# Patient Record
Sex: Female | Born: 1990 | Race: Black or African American | Hispanic: No | Marital: Single | Smoking: Former smoker
Health system: Southern US, Community
[De-identification: ages and names within clinical notes are randomized; demographics above are authoritative.]

## PROBLEM LIST (undated history)

## (undated) DIAGNOSIS — M546 Pain in thoracic spine: Secondary | ICD-10-CM

## (undated) DIAGNOSIS — F419 Anxiety disorder, unspecified: Secondary | ICD-10-CM

## (undated) DIAGNOSIS — D649 Anemia, unspecified: Secondary | ICD-10-CM

## (undated) DIAGNOSIS — F329 Major depressive disorder, single episode, unspecified: Secondary | ICD-10-CM

## (undated) DIAGNOSIS — F32A Depression, unspecified: Secondary | ICD-10-CM

## (undated) DIAGNOSIS — M419 Scoliosis, unspecified: Secondary | ICD-10-CM

## (undated) HISTORY — DX: Pain in thoracic spine: M54.6

## (undated) HISTORY — DX: Scoliosis, unspecified: M41.9

## (undated) HISTORY — DX: Depression, unspecified: F32.A

## (undated) HISTORY — DX: Anxiety disorder, unspecified: F41.9

---
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
## 2016-03-09 ENCOUNTER — Encounter (HOSPITAL_COMMUNITY): Payer: Self-pay | Admitting: *Deleted

## 2016-03-09 ENCOUNTER — Emergency Department (HOSPITAL_COMMUNITY)
Admission: EM | Admit: 2016-03-09 | Discharge: 2016-03-09 | Disposition: A | Discharge status: Home / Self Care | Payer: Medicaid Other | Attending: Emergency Medicine | Admitting: Emergency Medicine

## 2016-03-09 ENCOUNTER — Emergency Department (HOSPITAL_COMMUNITY): Payer: Medicaid Other

## 2016-03-09 DIAGNOSIS — R079 Chest pain, unspecified: Secondary | ICD-10-CM | POA: Insufficient documentation

## 2016-03-09 DIAGNOSIS — J111 Influenza due to unidentified influenza virus with other respiratory manifestations: Secondary | ICD-10-CM | POA: Insufficient documentation

## 2016-03-09 DIAGNOSIS — F17200 Nicotine dependence, unspecified, uncomplicated: Secondary | ICD-10-CM | POA: Insufficient documentation

## 2016-03-09 DIAGNOSIS — R109 Unspecified abdominal pain: Secondary | ICD-10-CM | POA: Diagnosis not present

## 2016-03-09 DIAGNOSIS — R6889 Other general symptoms and signs: Secondary | ICD-10-CM

## 2016-03-09 HISTORY — DX: Anemia, unspecified: D64.9

## 2016-03-09 LAB — URINALYSIS, ROUTINE W REFLEX MICROSCOPIC
Bilirubin Urine: NEGATIVE
GLUCOSE, UA: NEGATIVE mg/dL
HGB URINE DIPSTICK: NEGATIVE
KETONES UR: NEGATIVE mg/dL
LEUKOCYTES UA: NEGATIVE
Nitrite: NEGATIVE
PH: 6 (ref 5.0–8.0)
Protein, ur: NEGATIVE mg/dL
Specific Gravity, Urine: 1.019 (ref 1.005–1.030)

## 2016-03-09 LAB — PREGNANCY, URINE: Preg Test, Ur: NEGATIVE

## 2016-03-09 LAB — RAPID STREP SCREEN (MED CTR MEBANE ONLY): Streptococcus, Group A Screen (Direct): NEGATIVE

## 2016-03-09 MED ORDER — DOXYCYCLINE HYCLATE 100 MG PO CAPS: 100.0000 mg | ORAL_CAPSULE | Freq: Two times a day (BID) | ORAL | Status: DC

## 2016-03-09 MED ORDER — ACETAMINOPHEN 325 MG PO TABS
650.0000 mg | ORAL_TABLET | Freq: Once | ORAL | Status: AC | PRN
  Administered 2016-03-09: 650 mg via ORAL
  Filled 2016-03-09: qty 2

## 2016-03-09 NOTE — Progress Notes (Signed)
Entered in d/c instructions  [facility] Schedule an appointment as soon as possible for a visit on 03/09/2016 As needed This is your assigned Medicaid [place] access doctor If you prefer to see another Medicaid doctor other than the one on your Medicaid card PLEASE CALL DSS [phone] or [phone] [address] [place] [phone] Medicaid [place] Access Covered Patient [place]: [phone] [address] [place], [place] [id number] [email or web address].ncdhhs.gov/ Use this website to assist with understanding your coverage & to renew application As a Medicaid client you MUST contact DSS/SSI each time you change address, move to another [place] county or another state to keep your address updated  [place] Transportation to Dr appts if you are have full Medicaid: [phone], [phone]

## 2016-03-09 NOTE — ED Notes (Signed)
Generalized weakness, body aches, chills x 2 days sore throat today, vomited one time

## 2016-03-09 NOTE — Progress Notes (Signed)
[facility] is [facility] [address] [place] [phone]

## 2016-03-09 NOTE — Discharge Instructions (Signed)
Fever, Adult A fever is an increase in the body's temperature. It is usually defined as a temperature of 100F (38C) or higher. Brief mild or moderate fevers generally have no long-term effects, and they often do not require treatment. Moderate or high fevers may make you feel uncomfortable and can sometimes be a sign of a serious illness or disease. The sweating that may occur with repeated or prolonged fever may also cause dehydration. Fever is confirmed by taking a temperature with a thermometer. A measured temperature can vary with:  Age.  Time of day.  Location of the thermometer:  Mouth (oral).  Rectum (rectal).  Ear (tympanic).  Underarm (axillary).  Forehead (temporal). HOME CARE INSTRUCTIONS Pay attention to any changes in your symptoms. Take these actions to help with your condition:  Take over-the counter and prescription medicines only as told by your health care provider. Follow the dosing instructions carefully.  If you were prescribed an antibiotic medicine, take it as told by your health care provider. Do not stop taking the antibiotic even if you start to feel better.  Rest as needed.  Drink enough fluid to keep your urine clear or pale yellow. This helps to prevent dehydration.  Sponge yourself or bathe with room-temperature water to help reduce your body temperature as needed. Do not use ice water.  Do not overbundle yourself in blankets or heavy clothes. SEEK MEDICAL CARE IF:  You vomit.  You cannot eat or drink without vomiting.  You have diarrhea.  You have pain when you urinate.  Your symptoms do not improve with treatment.  You develop new symptoms.  You develop excessive weakness. SEEK IMMEDIATE MEDICAL CARE IF:  You have shortness of breath or have trouble breathing.  You are dizzy or you faint.  You are disoriented or confused.  You develop signs of dehydration, such as a dry mouth, decreased urination, or paleness.  You develop  severe pain in your abdomen.  You have persistent vomiting or diarrhea.  You develop a skin rash.  Your symptoms suddenly get worse.   This information is not intended to replace advice given to you by your health care provider. Make sure you discuss any questions you have with your health care provider.   Document Released: 01/30/2001 Document Revised: 04/27/2015 Document Reviewed: 09/30/2014 Elsevier Interactive Patient Education 2016 Elsevier Inc. Upper Respiratory Infection, Adult Most upper respiratory infections (URIs) are a viral infection of the air passages leading to the lungs. A URI affects the nose, throat, and upper air passages. The most common type of URI is nasopharyngitis and is typically referred to as "the common cold." URIs run their course and usually go away on their own. Most of the time, a URI does not require medical attention, but sometimes a bacterial infection in the upper airways can follow a viral infection. This is called a secondary infection. Sinus and middle ear infections are common types of secondary upper respiratory infections. Bacterial pneumonia can also complicate a URI. A URI can worsen asthma and chronic obstructive pulmonary disease (COPD). Sometimes, these complications can require emergency medical care and may be life threatening.  CAUSES Almost all URIs are caused by viruses. A virus is a type of germ and can spread from one person to another.  RISKS FACTORS You may be at risk for a URI if:   You smoke.   You have chronic heart or lung disease.  You have a weakened defense (immune) system.   You are very young or  very old.   You have nasal allergies or asthma.  You work in crowded or poorly ventilated areas.  You work in health care facilities or schools. SIGNS AND SYMPTOMS  Symptoms typically develop 2-3 days after you come in contact with a cold virus. Most viral URIs last 7-10 days. However, viral URIs from the influenza virus  (flu virus) can last 14-18 days and are typically more severe. Symptoms may include:   Runny or stuffy (congested) nose.   Sneezing.   Cough.   Sore throat.   Headache.   Fatigue.   Fever.   Loss of appetite.   Pain in your forehead, behind your eyes, and over your cheekbones (sinus pain).  Muscle aches.  DIAGNOSIS  Your health care provider may diagnose a URI by:  Physical exam.  Tests to check that your symptoms are not due to another condition such as:  Strep throat.  Sinusitis.  Pneumonia.  Asthma. TREATMENT  A URI goes away on its own with time. It cannot be cured with medicines, but medicines may be prescribed or recommended to relieve symptoms. Medicines may help:  Reduce your fever.  Reduce your cough.  Relieve nasal congestion. HOME CARE INSTRUCTIONS   Take medicines only as directed by your health care provider.   Gargle warm saltwater or take cough drops to comfort your throat as directed by your health care provider.  Use a warm mist humidifier or inhale steam from a shower to increase air moisture. This may make it easier to breathe.  Drink enough fluid to keep your urine clear or pale yellow.   Eat soups and other clear broths and maintain good nutrition.   Rest as needed.   Return to work when your temperature has returned to normal or as your health care provider advises. You may need to stay home longer to avoid infecting others. You can also use a face mask and careful hand washing to prevent spread of the virus.  Increase the usage of your inhaler if you have asthma.   Do not use any tobacco products, including cigarettes, chewing tobacco, or electronic cigarettes. If you need help quitting, ask your health care provider. PREVENTION  The best way to protect yourself from getting a cold is to practice good hygiene.   Avoid oral or hand contact with people with cold symptoms.   Wash your hands often if contact occurs.   There is no clear evidence that vitamin C, vitamin E, echinacea, or exercise reduces the chance of developing a cold. However, it is always recommended to get plenty of rest, exercise, and practice good nutrition.  SEEK MEDICAL CARE IF:   You are getting worse rather than better.   Your symptoms are not controlled by medicine.   You have chills.  You have worsening shortness of breath.  You have brown or red mucus.  You have yellow or brown nasal discharge.  You have pain in your face, especially when you bend forward.  You have a fever.  You have swollen neck glands.  You have pain while swallowing.  You have white areas in the back of your throat. SEEK IMMEDIATE MEDICAL CARE IF:   You have severe or persistent:  Headache.  Ear pain.  Sinus pain.  Chest pain.  You have chronic lung disease and any of the following:  Wheezing.  Prolonged cough.  Coughing up blood.  A change in your usual mucus.  You have a stiff neck.  You have changes in  your:  Vision.  Hearing.  Thinking.  Mood. MAKE SURE YOU:   Understand these instructions.  Will watch your condition.  Will get help right away if you are not doing well or get worse.   This information is not intended to replace advice given to you by your health care provider. Make sure you discuss any questions you have with your health care provider.   Document Released: 01/30/2001 Document Revised: 12/21/2014 Document Reviewed: 11/11/2013 Elsevier Interactive Patient Education 2016 Elsevier Inc. Doxycycline tablets or capsules What is this medicine? DOXYCYCLINE (dox i SYE kleen) is a tetracycline antibiotic. It kills certain bacteria or stops their growth. It is used to treat many kinds of infections, like dental, skin, respiratory, and urinary tract infections. It also treats acne, Lyme disease, malaria, and certain sexually transmitted infections. This medicine may be used for other purposes; ask  your health care provider or pharmacist if you have questions. What should I tell my health care provider before I take this medicine? They need to know if you have any of these conditions: -liver disease -long exposure to sunlight like working outdoors -stomach problems like colitis -an unusual or allergic reaction to doxycycline, tetracycline antibiotics, other medicines, foods, dyes, or preservatives -pregnant or trying to get pregnant -breast-feeding How should I use this medicine? Take this medicine by mouth with a full glass of water. Follow the directions on the prescription label. It is best to take this medicine without food, but if it upsets your stomach take it with food. Take your medicine at regular intervals. Do not take your medicine more often than directed. Take all of your medicine as directed even if you think you are better. Do not skip doses or stop your medicine early. Talk to your pediatrician regarding the use of this medicine in children. While this drug may be prescribed for selected conditions, precautions do apply. Overdosage: If you think you have taken too much of this medicine contact a poison control center or emergency room at once. NOTE: This medicine is only for you. Do not share this medicine with others. What if I miss a dose? If you miss a dose, take it as soon as you can. If it is almost time for your next dose, take only that dose. Do not take double or extra doses. What may interact with this medicine? -antacids -barbiturates -birth control pills -bismuth subsalicylate -carbamazepine -methoxyflurane -other antibiotics -phenytoin -vitamins that contain iron -warfarin This list may not describe all possible interactions. Give your health care provider a list of all the medicines, herbs, non-prescription drugs, or dietary supplements you use. Also tell them if you smoke, drink alcohol, or use illegal drugs. Some items may interact with your  medicine. What should I watch for while using this medicine? Tell your doctor or health care professional if your symptoms do not improve. Do not treat diarrhea with over the counter products. Contact your doctor if you have diarrhea that lasts more than 2 days or if it is severe and watery. Do not take this medicine just before going to bed. It may not dissolve properly when you lay down and can cause pain in your throat. Drink plenty of fluids while taking this medicine to also help reduce irritation in your throat. This medicine can make you more sensitive to the sun. Keep out of the sun. If you cannot avoid being in the sun, wear protective clothing and use sunscreen. Do not use sun lamps or tanning beds/booths. Birth control  pills may not work properly while you are taking this medicine. Talk to your doctor about using an extra method of birth control. If you are being treated for a sexually transmitted infection, avoid sexual contact until you have finished your treatment. Your sexual partner may also need treatment. Avoid antacids, aluminum, calcium, magnesium, and iron products for 4 hours before and 2 hours after taking a dose of this medicine. If you are using this medicine to prevent malaria, you should still protect yourself from contact with mosquitos. Stay in screened-in areas, use mosquito nets, keep your body covered, and use an insect repellent. What side effects may I notice from receiving this medicine? Side effects that you should report to your doctor or health care professional as soon as possible: -allergic reactions like skin rash, itching or hives, swelling of the face, lips, or tongue -difficulty breathing -fever -itching in the rectal or genital area -pain on swallowing -redness, blistering, peeling or loosening of the skin, including inside the mouth -severe stomach pain or cramps -unusual bleeding or bruising -unusually weak or tired -yellowing of the eyes or  skin Side effects that usually do not require medical attention (report to your doctor or health care professional if they continue or are bothersome): -diarrhea -loss of appetite -nausea, vomiting This list may not describe all possible side effects. Call your doctor for medical advice about side effects. You may report side effects to FDA at 1-800-FDA-1088. Where should I keep my medicine? Keep out of the reach of children. Store at room temperature, below 30 degrees C (86 degrees F). Protect from light. Keep container tightly closed. Throw away any unused medicine after the expiration date. Taking this medicine after the expiration date can make you seriously ill. NOTE: This sheet is a summary. It may not cover all possible information. If you have questions about this medicine, talk to your doctor, pharmacist, or health care provider.    2016, Elsevier/Gold Standard. (2014-11-26 12:10:28)

## 2016-03-09 NOTE — ED Provider Notes (Signed)
CSN: [id number]     Arrival date & time 03/09/16  1456 History  By signing my name below, I, [facility] Inc[facility]arrissa Howard, attest that this documentation has been prepared under the direction and in the presence of Arthor CaptainAbigail Jolane Bankhead, PA-C. Electronically Signed: Randell PatientMarrissa Howard, ED Scribe. 03/09/2016. 5:15 PM.    Chief Complaint  Patient presents with  . flu like symptoms      The history is provided by the patient. No language interpreter was used.   HPI Comments: Ruth Howard is a 25 y.o. female who presents to the Emergency Department complaining of constant, moderate, sore generalized body aches, worse in the central back onset 2 days ago. Pt states that she noticed an insect fly up and bite her on the left chest earlier this week followed by a cough and then by a sore throat, sore CP, mild SOB, and subjective fever (TMAX 101.6 in ED today) over the past 2 days. She reports left flank pain. She has taken Tylenol without relief. Pt states that she is allergic to NSAIDs which cause her throat to swell. Denies hx of asthma. Denies recent tick bites. Denies urinary frequency or urgency.  Past Medical History  Diagnosis Date  . Anemia    Past Surgical History  Procedure Laterality Date  . Cesarean section     No family history on file. Social History  Substance Use Topics  . Smoking status: Current Every Day Smoker  . Smokeless tobacco: None  . Alcohol Use: No   OB History    No data available     Review of Systems  Constitutional: Positive for fever.  Respiratory: Positive for cough and shortness of breath.   Cardiovascular: Positive for chest pain.  Genitourinary: Positive for flank pain. Negative for urgency and frequency.      Allergies  Nsaids  Home Medications   Prior to Admission medications   Medication Sig Start Date End Date Taking? Authorizing Provider  doxycycline (VIBRAMYCIN) 100 MG capsule Take 1 capsule (100 mg total) by mouth 2 (two) times daily. 03/09/16    Jemari Hallum, PA-C   BP 109/70 mmHg  Pulse 108  Temp(Src) 100 F (37.8 C) (Oral)  Resp 18  Ht 5\' 1"  (1.549 m)  Wt 130 lb (58.968 kg)  BMI 24.58 kg/m2  SpO2 100%  LMP 02/21/2016 Physical Exam  Constitutional: She is oriented to person, place, and time. She appears well-developed and well-nourished. No distress.  HENT:  Head: Normocephalic and atraumatic.  Mouth/Throat: Posterior oropharyngeal erythema present. No tonsillar abscesses.  Mild erythema of posterior oropharnyx. No tonsillar lymphadenopathy.  Eyes: Conjunctivae and EOM are normal. Pupils are equal, round, and reactive to light. No scleral icterus.  No horizontal, vertical or rotational nystagmus  Neck: Normal range of motion. Neck supple.  Full active and passive ROM without pain No midline or paraspinal tenderness No nuchal rigidity or meningeal signs  Cardiovascular: Normal rate, regular rhythm and intact distal pulses.   Pulmonary/Chest: Effort normal and breath sounds normal. No respiratory distress. She has no wheezes. She has no rales.  Lungs CTA bilaterally.  Abdominal: Soft. Bowel sounds are normal. There is no tenderness. There is no rebound and no guarding.  Musculoskeletal: Normal range of motion.  Lymphadenopathy:    She has no cervical adenopathy.  Neurological: She is alert and oriented to person, place, and time. She has normal reflexes. No cranial nerve deficit. She exhibits normal muscle tone. Coordination normal.  Mental Status:  Alert, oriented, thought content appropriate. Speech fluent  without evidence of aphasia. Able to follow 2 step commands without difficulty.  Cranial Nerves:  II:  Peripheral visual fields grossly normal, pupils equal, round, reactive to light III,IV, VI: ptosis not present, extra-ocular motions intact bilaterally  V,VII: smile symmetric, facial light touch sensation equal VIII: hearing grossly normal bilaterally  IX,X: midline uvula rise  XI: bilateral shoulder shrug  equal and strong XII: midline tongue extension  Motor:  5/5 in upper and lower extremities bilaterally including strong and equal grip strength and dorsiflexion/plantar flexion Sensory: Pinprick and light touch normal in all extremities.  Deep Tendon Reflexes: 2+ and symmetric  Cerebellar: normal finger-to-nose with bilateral upper extremities Gait: normal gait and balance CV: distal pulses palpable throughout   Skin: Skin is warm and dry. No rash noted. She is not diaphoretic.  Psychiatric: She has a normal mood and affect. Her behavior is normal. Judgment and thought content normal.  Nursing note and vitals reviewed.   ED Course  Procedures   DIAGNOSTIC STUDIES: Oxygen Saturation is 100% on RA, normal by my interpretation.    COORDINATION OF CARE: 4:14 PM Discussed results of rapid strep test. Will order chest x-ray and urinalysis. Discussed treatment plan with pt at bedside and pt agreed to plan.  5:27 PM Reviewed results of labs and chest imaging. Will prescribe doxycycline. Will discharge pt.  Labs Review Labs Reviewed  URINALYSIS, ROUTINE W REFLEX MICROSCOPIC (NOT AT [facility]) - Abnormal; Notable for the following:    APPearance CLOUDY (*)    All other components within normal limits  RAPID STREP SCREEN (NOT AT [facility])  CULTURE, GROUP A STREP [facility])  PREGNANCY, URINE    Imaging Review Dg Chest 2 View  03/09/2016  CLINICAL DATA:  Fever for 2 days. EXAM: CHEST  2 VIEW COMPARISON:  None. FINDINGS: The heart size and mediastinal contours are within normal limits. Both lungs are clear. Mild rightward curvature is present in the lower thoracic spine. Vertebral body heights and alignment are maintained. IMPRESSION: No active cardiopulmonary disease. Electronically Signed   By: Marin Roberts M.D.   On: 03/09/2016 16:57   I have personally reviewed and evaluated these images and lab results as part of my medical decision-making.   MDM   Final diagnoses:  Flu-like symptoms    No signs of meningitis Patient with flu-like symptoms in the summertime. Fever improved with Tylenol. Vitals are stable, low-grade fever.  No signs of dehydration, tolerating PO's. Lungs are clear. Due to patient's presentation and physical exam a chest x-ray was ordered. Discussed tick-borne illnesses with pt and will treat with 10-day course of doxycycline. Patient will be discharged with instructions to orally hydrate, rest, and use Tylenol for fever.  I personally performed the services described in this documentation, which was scribed in my presence. The recorded information has been reviewed and is accurate.       Arthor Captain, PA-C 03/09/16 1836   Leta Baptist, MD 03/12/16 [phone]

## 2016-03-12 LAB — CULTURE, GROUP A STREP (THRC)

## 2016-05-21 ENCOUNTER — Encounter (HOSPITAL_COMMUNITY): Payer: Self-pay | Admitting: Emergency Medicine

## 2016-05-21 ENCOUNTER — Emergency Department (HOSPITAL_COMMUNITY)
Admission: EM | Admit: 2016-05-21 | Discharge: 2016-05-21 | Disposition: A | Discharge status: Home / Self Care | Payer: Medicaid Other | Attending: Emergency Medicine | Admitting: Emergency Medicine

## 2016-05-21 DIAGNOSIS — Z87891 Personal history of nicotine dependence: Secondary | ICD-10-CM | POA: Diagnosis not present

## 2016-05-21 DIAGNOSIS — K0889 Other specified disorders of teeth and supporting structures: Secondary | ICD-10-CM

## 2016-05-21 DIAGNOSIS — K029 Dental caries, unspecified: Secondary | ICD-10-CM | POA: Diagnosis not present

## 2016-05-21 MED ORDER — CLINDAMYCIN HCL 150 MG PO CAPS: 450.0000 mg | ORAL_CAPSULE | Freq: Three times a day (TID) | ORAL | 0 refills | Status: DC

## 2016-05-21 MED ORDER — IBUPROFEN 800 MG PO TABS: 800.0000 mg | ORAL_TABLET | Freq: Three times a day (TID) | ORAL | 0 refills | Status: DC

## 2016-05-21 NOTE — Discharge Instructions (Signed)
Please follow up with a Dentist for further evaluation and management.   °Check this website for free, low-income or sliding scale dental services in [place]. www.freedental.us   °To find a dentist in the [place] or surrounding areas check this website: [email or web address] ° °

## 2016-05-21 NOTE — ED Triage Notes (Signed)
Pt complaint of generalized dental pain related to multiple cavities worsening over past week.

## 2016-05-21 NOTE — ED Provider Notes (Signed)
[facility]-EMERGENCY DEPT Provider Note   CSN: [id number] Arrival date & time: 05/21/16  1421  By signing my name below, I, Soijett Blue, attest that this documentation has been prepared under the direction and in the presence of Cheri FowlerKayla Jazae Gandolfi, PA-C Electronically Signed: Soijett Blue, ED Scribe. 05/21/16. 3:26 PM.   History   Chief Complaint Chief Complaint  Patient presents with  . Dental Pain    HPI Ruth Howard is a 25 y.o. female who presents to the Emergency Department complaining of generalized dental pain onset 1 week. Pt reports that her generalized dental pain is worsened with temperature changes. Pt denies any alleviating factors for her generalized dental pain. Pt denies having a dentist. She states that she is having associated symptoms of gum swelling. She states that she has tried acetaminophen, ibuprofen, and an old abx Rx with no relief for her symptoms. Pt reports that her last dose of acetaminophen was this morning. She denies fever, tongue swelling, facial swelling, trouble swallowing, fever, chills, and any other symptoms.    The history is provided by the patient. No language interpreter was used.    Past Medical History:  Diagnosis Date  . Anemia     There are no active problems to display for this patient.   Past Surgical History:  Procedure Laterality Date  . CESAREAN SECTION      OB History    No data available       Home Medications    Prior to Admission medications   Medication Sig Start Date End Date Taking? Authorizing Provider  clindamycin (CLEOCIN) 150 MG capsule Take 3 capsules (450 mg total) by mouth 3 (three) times daily. 05/21/16   Cheri FowlerKayla Leamon Palau, PA-C  doxycycline (VIBRAMYCIN) 100 MG capsule Take 1 capsule (100 mg total) by mouth 2 (two) times daily. 03/09/16   Arthor CaptainAbigail Harris, PA-C  ibuprofen (ADVIL,MOTRIN) 800 MG tablet Take 1 tablet (800 mg total) by mouth 3 (three) times daily. 05/21/16   Cheri FowlerKayla Aireana Ryland, PA-C    Family History No family history  on file.  Social History Social History  Substance Use Topics  . Smoking status: Former Smoker    Years: 1.00  . Smokeless tobacco: Not on file     Comment: Quit 04/20/16  . Alcohol use No     Allergies   Nsaids   Review of Systems Review of Systems  Constitutional: Negative for chills and fever.  HENT: Positive for dental problem. Negative for facial swelling and trouble swallowing.      Physical Exam Updated Vital Signs BP 116/80 (BP Location: Left Arm)   Pulse 96   Temp 97.9 F (36.6 C) (Oral)   Resp 18   Ht 5\' 1"  (1.549 m)   Wt 54.4 kg   LMP 04/23/2016   SpO2 100%   BMI 22.67 kg/m   Physical Exam  Constitutional: She is oriented to person, place, and time. She appears well-developed and well-nourished.  HENT:  Head: Normocephalic and atraumatic.  Mouth/Throat: Uvula is midline, oropharynx is clear and moist and mucous membranes are normal. No trismus in the jaw. Abnormal dentition. Dental caries present.  No tongue swelling or facial swelling.  Airway patent. Patient tolerating secretions without difficulty.   Eyes: Conjunctivae are normal.  Neck: Normal range of motion. Neck supple.  No evidence of Ludwigs angina.  Cardiovascular: Normal rate, regular rhythm and normal heart sounds.   Pulmonary/Chest: Effort normal and breath sounds normal.  Abdominal: Bowel sounds are normal. She exhibits no distension.  Musculoskeletal:  Moves all extremities spontaneously.  Lymphadenopathy:    She has no cervical adenopathy.  Neurological: She is alert and oriented to person, place, and time.  Speech clear without dysarthria.   Skin: Skin is warm and dry.     ED Treatments / Results  DIAGNOSTIC STUDIES: Oxygen Saturation is 100% on RA, nl by my interpretation.    COORDINATION OF CARE: 3:23 PM Discussed treatment plan with pt at bedside which includes clindamycin Rx and pt agreed to plan.   Procedures Procedures (including critical care time)  Medications  Ordered in ED Medications - No data to display   Initial Impression / Assessment and Plan / ED Course  I have reviewed the triage vital signs and the nursing notes.   Clinical Course   Patient with dentalgia.  No abscess requiring immediate incision and drainage.  Exam not concerning for Ludwig's angina or pharyngeal abscess.  Will treat with clindamycin Rx and ibuprofen Rx. Pt instructed to follow-up with dentist.  Discussed return precautions. Pt safe for discharge.  Final Clinical Impressions(s) / ED Diagnoses   Final diagnoses:  Pain, dental    New Prescriptions New Prescriptions   CLINDAMYCIN (CLEOCIN) 150 MG CAPSULE    Take 3 capsules (450 mg total) by mouth 3 (three) times daily.   IBUPROFEN (ADVIL,MOTRIN) 800 MG TABLET    Take 1 tablet (800 mg total) by mouth 3 (three) times daily.   I personally performed the services described in this documentation, which was scribed in my presence. The recorded information has been reviewed and is accurate.      Cheri Fowler, PA-C 05/21/16 1529    Doug Sou, MD 05/22/16 0000

## 2018-03-10 DIAGNOSIS — E119 Type 2 diabetes mellitus without complications: Secondary | ICD-10-CM | POA: Diagnosis not present

## 2018-03-10 DIAGNOSIS — G8929 Other chronic pain: Secondary | ICD-10-CM | POA: Diagnosis not present

## 2018-06-22 DIAGNOSIS — O34211 Maternal care for low transverse scar from previous cesarean delivery: Secondary | ICD-10-CM | POA: Diagnosis not present

## 2018-06-22 DIAGNOSIS — Z3A37 37 weeks gestation of pregnancy: Secondary | ICD-10-CM | POA: Diagnosis not present

## 2018-06-22 DIAGNOSIS — O0930 Supervision of pregnancy with insufficient antenatal care, unspecified trimester: Secondary | ICD-10-CM | POA: Diagnosis not present

## 2018-06-23 DIAGNOSIS — O411290 Chorioamnionitis, unspecified trimester, not applicable or unspecified: Secondary | ICD-10-CM | POA: Diagnosis not present

## 2019-05-15 ENCOUNTER — Other Ambulatory Visit: Payer: Self-pay | Admitting: Emergency Medicine

## 2019-05-15 ENCOUNTER — Encounter (HOSPITAL_COMMUNITY): Payer: Self-pay

## 2019-05-15 ENCOUNTER — Ambulatory Visit (HOSPITAL_COMMUNITY)
Admission: EM | Admit: 2019-05-15 | Discharge: 2019-05-15 | Disposition: A | Discharge status: Home / Self Care | Payer: Medicaid Other | Attending: Family Medicine | Admitting: Family Medicine

## 2019-05-15 ENCOUNTER — Other Ambulatory Visit: Payer: Self-pay

## 2019-05-15 DIAGNOSIS — M545 Low back pain, unspecified: Secondary | ICD-10-CM

## 2019-05-15 DIAGNOSIS — Z3202 Encounter for pregnancy test, result negative: Secondary | ICD-10-CM | POA: Diagnosis not present

## 2019-05-15 DIAGNOSIS — M79604 Pain in right leg: Secondary | ICD-10-CM | POA: Diagnosis not present

## 2019-05-15 DIAGNOSIS — M79605 Pain in left leg: Secondary | ICD-10-CM | POA: Insufficient documentation

## 2019-05-15 LAB — POCT URINALYSIS DIP (DEVICE)
Bilirubin Urine: NEGATIVE
Glucose, UA: NEGATIVE mg/dL
Hgb urine dipstick: NEGATIVE
Ketones, ur: NEGATIVE mg/dL
Leukocytes,Ua: NEGATIVE
Nitrite: NEGATIVE
Protein, ur: NEGATIVE mg/dL
Specific Gravity, Urine: 1.025 (ref 1.005–1.030)
Urobilinogen, UA: 0.2 mg/dL (ref 0.0–1.0)
pH: 7 (ref 5.0–8.0)

## 2019-05-15 LAB — POCT PREGNANCY, URINE: Preg Test, Ur: NEGATIVE

## 2019-05-15 MED ORDER — IBUPROFEN 600 MG PO TABS: 600.0000 mg | ORAL_TABLET | Freq: Four times a day (QID) | ORAL | 0 refills | Status: DC | PRN

## 2019-05-15 MED ORDER — ACETAMINOPHEN 500 MG PO TABS: 500.0000 mg | ORAL_TABLET | Freq: Four times a day (QID) | ORAL | 0 refills | Status: DC | PRN

## 2019-05-15 NOTE — Discharge Instructions (Signed)
Your urine does not indicate urinary tract infection.  I have sent this for culture to confirm.  Please discontinue use of your left over amoxicillin.  Will notify you of any positive findings from your vaginal swab and if any changes to treatment are needed.   Light and regular activity as tolerated, stretching, heat application, use of ibuprofen or aleve to help with pain.  If symptoms worsen or do not improve in the next week to return to be seen or to follow up with your PCP or gynecology

## 2019-05-15 NOTE — ED Provider Notes (Signed)
[facility]    CSN: [id number] Arrival date & time: 05/15/19  1052      History   Chief Complaint Chief Complaint  Patient presents with  . Urinary Tract Infection    HPI Ruth Howard is a 28 y.o. female.   Ruth Howard presents with complaints of low back pain as well as bilateral thigh and hip pain. Certain positions worsen the pain, feels better with standing. No known injury. She does work in [employer]. Started three days ago. No fevers. No abdominal pain. Intermittent headache. Once she had burnign with urination but this has resolved. No frequency with urination. For the past three days she has been taking left over amoxicillin, twice a day. Unknown dose. Left over from dental procedure. She has been use boric acid suppositories for concern for yeast infection as well for the past week. Her discharge and itching has improved. She is sexually active  With two partners, doesn't use condoms, no known std exposure. Nausea after taking amoxicillin this morning. No vomiting or diarrhea. No known ill contacts. History  Of UTI which she feels like felt similar. Took tylenol which didn't help. Without contributing medical history.      ROS per HPI, negative if not otherwise mentioned.      Past Medical History:  Diagnosis Date  . Allergy   . Anemia     There are no active problems to display for this patient.   Past Surgical History:  Procedure Laterality Date  . CESAREAN SECTION      OB History   No obstetric history on file.      Home Medications    Prior to Admission medications   Medication Sig Start Date End Date Taking? Authorizing Provider  acetaminophen (TYLENOL) 500 MG tablet Take 1 tablet (500 mg total) by mouth every 6 (six) hours as needed. 05/15/19   Zigmund Gottron, NP  clindamycin (CLEOCIN) 150 MG capsule Take 3 capsules (450 mg total) by mouth 3 (three) times daily. 05/21/16   Gloriann Loan, PA-C  doxycycline (VIBRAMYCIN) 100 MG capsule  Take 1 capsule (100 mg total) by mouth 2 (two) times daily. 03/09/16   Harris, Vernie Shanks, PA-C  ibuprofen (ADVIL) 600 MG tablet Take 1 tablet (600 mg total) by mouth every 6 (six) hours as needed. 05/15/19   Zigmund Gottron, NP    Family History Family History  Family history unknown: Yes    Social History Social History   Tobacco Use  . Smoking status: Former Smoker    Years: 1.00  . Tobacco comment: Quit 04/20/16  Substance Use Topics  . Alcohol use: No  . Drug use: No     Allergies   Nsaids and Peanut-containing drug products   Review of Systems Review of Systems   Physical Exam Triage Vital Signs ED Triage Vitals  Enc Vitals Group     BP 05/15/19 1129 113/70     Pulse Rate 05/15/19 1129 (!) 106     Resp 05/15/19 1129 17     Temp 05/15/19 1129 98.6 F (37 C)     Temp Source 05/15/19 1129 Oral     SpO2 05/15/19 1129 100 %     Weight --      Height --      Head Circumference --      Peak Flow --      Pain Score 05/15/19 1130 6     Pain Loc --      Pain Edu? --  Excl. in GC? --    No data found.  Updated Vital Signs BP 113/70 (BP Location: Right Arm)   Pulse (!) 106   Temp 98.6 F (37 C) (Oral)   Resp 17   LMP 05/01/2019   SpO2 100%    Physical Exam Constitutional:      General: She is not in acute distress.    Appearance: She is well-developed.  Cardiovascular:     Rate and Rhythm: Normal rate.  Pulmonary:     Effort: Pulmonary effort is normal.  Abdominal:     Tenderness: There is generalized abdominal tenderness and tenderness in the suprapubic area. There is no right CVA tenderness, left CVA tenderness, guarding or rebound.  Genitourinary:    Labia:        Right: No rash, tenderness or lesion.        Left: No rash, tenderness or lesion.      Vagina: Normal.     Cervix: No cervical motion tenderness or friability.     Uterus: Normal.      Adnexa: Right adnexa normal and left adnexa normal.     Comments: Mild dilation of os with egg  white thin stretching discharge noted; no CMT; no pain with bimanual exam  Musculoskeletal:     Lumbar back: She exhibits tenderness. She exhibits no bony tenderness and no swelling.       Back:     Comments: Generalized low back pain on palpation, some pain with transition from sit to lay and lay to sit; strength equal bilaterally; gross sensation intact to lower extremities  Skin:    General: Skin is warm and dry.  Neurological:     Mental Status: She is alert and oriented to person, place, and time.      UC Treatments / Results  Labs (all labs ordered are listed, but only abnormal results are displayed) Labs Reviewed  URINE CULTURE  POC URINE PREG, ED  POCT URINALYSIS DIP (DEVICE)  POCT PREGNANCY, URINE  CERVICOVAGINAL ANCILLARY ONLY    EKG   Radiology No results found.  Procedures Procedures (including critical care time)  Medications Ordered in UC Medications - No data to display  Initial Impression / Assessment and Plan / UC Course  I have reviewed the triage vital signs and the nursing notes.  Pertinent labs & imaging results that were available during my care of the patient were reviewed by me and considered in my medical decision making (see chart for details).     Tachycardia on arrival. Patient does appear uncomfortable with back and hip pain. Exam is quite unremarkable. Urine is normal, however has been taking left over amoxicillin. Encouraged patient to discontinue this and not take left over medications. Vaginal cytology and urine culture pending. Patient declines toradol in clinic today, states will take oral medications. Suspect musculoskeletal pain? .nre If symptoms worsen or do not improve in the next week to return to be seen or to follow up with PCP.  Patient verbalized understanding and agreeable to plan.   Final Clinical Impressions(s) / UC Diagnoses   Final diagnoses:  Acute bilateral low back pain without sciatica  Leg pain, bilateral      Discharge Instructions     Your urine does not indicate urinary tract infection.  I have sent this for culture to confirm.  Please discontinue use of your left over amoxicillin.  Will notify you of any positive findings from your vaginal swab and if any changes to treatment are needed.  Light and regular activity as tolerated, stretching, heat application, use of ibuprofen or aleve to help with pain.  If symptoms worsen or do not improve in the next week to return to be seen or to follow up with your PCP or gynecology     ED Prescriptions    Medication Sig Dispense Auth. Provider   acetaminophen (TYLENOL) 500 MG tablet Take 1 tablet (500 mg total) by mouth every 6 (six) hours as needed. 30 tablet Linus Mako B, NP   ibuprofen (ADVIL) 600 MG tablet Take 1 tablet (600 mg total) by mouth every 6 (six) hours as needed. 30 tablet Georgetta Haber, NP     PDMP not reviewed this encounter.   Georgetta Haber, NP 05/15/19 1240

## 2019-05-15 NOTE — ED Triage Notes (Signed)
Pt presents with urinary tract symptoms; flank pain, nausea, urinary frequency, and urine odor X 3 days.

## 2019-05-16 LAB — URINE CULTURE: Culture: NO GROWTH

## 2019-05-18 ENCOUNTER — Telehealth (HOSPITAL_COMMUNITY): Payer: Self-pay | Admitting: Emergency Medicine

## 2019-05-18 LAB — CERVICOVAGINAL ANCILLARY ONLY
Bacterial Vaginitis (gardnerella): POSITIVE — AB
Candida Glabrata: NEGATIVE
Candida Vaginitis: NEGATIVE
Molecular Disclaimer: NEGATIVE
Molecular Disclaimer: NEGATIVE
Molecular Disclaimer: NEGATIVE
Molecular Disclaimer: NORMAL
Trichomonas: POSITIVE — AB

## 2019-05-18 MED ORDER — METRONIDAZOLE 500 MG PO TABS: 500.0000 mg | ORAL_TABLET | Freq: Two times a day (BID) | ORAL | 0 refills | Status: AC

## 2019-05-18 NOTE — Telephone Encounter (Signed)
Bacterial vaginosis is positive. This was not treated at the urgent care visit.  Flagyl 500 mg BID x 7 days #14 no refills sent to patients pharmacy of choice.    Trichomonas is positive. Rx  for Flagyl once was sent to the pharmacy of record. Pt needs education to refrain from sexual intercourse for 7 days to give the medicine time to work. Sexual partners need to be notified and tested/treated. Condoms may reduce risk of reinfection. Recheck for further evaluation if symptoms are not improving.   Pending G/C,  Patient contacted and made aware of    results, all questions answered

## 2019-05-19 LAB — CERVICOVAGINAL ANCILLARY ONLY
Chlamydia: NEGATIVE
Neisseria Gonorrhea: NEGATIVE

## 2019-05-26 ENCOUNTER — Encounter: Payer: Self-pay | Admitting: Family Medicine

## 2019-05-26 ENCOUNTER — Ambulatory Visit (INDEPENDENT_AMBULATORY_CARE_PROVIDER_SITE_OTHER): Payer: Medicaid Other | Admitting: Family Medicine

## 2019-05-26 ENCOUNTER — Other Ambulatory Visit: Payer: Self-pay

## 2019-05-26 VITALS — BP 108/58 | Wt 132.0 lb

## 2019-05-26 DIAGNOSIS — G8929 Other chronic pain: Secondary | ICD-10-CM | POA: Diagnosis not present

## 2019-05-26 DIAGNOSIS — Z0000 Encounter for general adult medical examination without abnormal findings: Secondary | ICD-10-CM | POA: Insufficient documentation

## 2019-05-26 DIAGNOSIS — F419 Anxiety disorder, unspecified: Secondary | ICD-10-CM | POA: Diagnosis not present

## 2019-05-26 DIAGNOSIS — D649 Anemia, unspecified: Secondary | ICD-10-CM | POA: Diagnosis not present

## 2019-05-26 DIAGNOSIS — F32A Depression, unspecified: Secondary | ICD-10-CM | POA: Insufficient documentation

## 2019-05-26 DIAGNOSIS — F329 Major depressive disorder, single episode, unspecified: Secondary | ICD-10-CM | POA: Diagnosis not present

## 2019-05-26 DIAGNOSIS — Z8249 Family history of ischemic heart disease and other diseases of the circulatory system: Secondary | ICD-10-CM

## 2019-05-26 DIAGNOSIS — Z833 Family history of diabetes mellitus: Secondary | ICD-10-CM | POA: Diagnosis not present

## 2019-05-26 DIAGNOSIS — R519 Headache, unspecified: Secondary | ICD-10-CM | POA: Diagnosis not present

## 2019-05-26 LAB — POCT GLYCOSYLATED HEMOGLOBIN (HGB A1C): Hemoglobin A1C: 5.4 % (ref 4.0–5.6)

## 2019-05-26 MED ORDER — SERTRALINE HCL 50 MG PO TABS: 50.0000 mg | ORAL_TABLET | Freq: Every day | ORAL | 0 refills | Status: DC
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
## 2019-05-26 NOTE — Patient Instructions (Signed)
It was great meeting you today!   -Pick up your new prescriptions from the pharmacy.   - Return for follow up in 1 month.    Please check-out at the front desk before leaving the clinic. I'd like to see you back 1 month but if you need to be seen earlier than that for any new issues we're happy to fit you in, just give [place] a call!  We are checking some labs today. If results require attention, either myself or my nurse will get in touch with you. If everything is normal, you will get a letter in the mail or a message in My Chart. Please give [place] a call if you do not hear from [place] after 2 weeks.  Please bring all of your medications with you to each visit.    Sign up for My Chart to have easy access to your labs results, and communication with your primary care physician.  Feel free to call with any questions or concerns at any time, at [phone].   Take care,  Dr. Rushie [facility]

## 2019-05-26 NOTE — Assessment & Plan Note (Addendum)
GAD 7 : Generalized Anxiety Score 05/26/2019  Nervous, Anxious, on Edge 3  Control/stop worrying 3  Worry too much - different things 3  Trouble relaxing 3  Restless 0  Easily annoyed or irritable 0  Afraid - awful might happen 3  Total GAD 7 Score 15       Depression screen PHQ 2/9 05/26/2019  Decreased Interest 1  Down, Depressed, Hopeless 1  PHQ - 2 Score 2  Altered sleeping 3  Tired, decreased energy 1  Change in appetite 1  Feeling bad or failure about yourself  0  Trouble concentrating 3  Moving slowly or fidgety/restless 0  Suicidal thoughts 0  PHQ-9 Score 10  Difficult doing work/chores Not difficult at all   Start Sertraline 50 mg daily. Follow up in 4 weeks for medication adjustment. Repeat PHQ9 and GAD 7.

## 2019-05-26 NOTE — Assessment & Plan Note (Signed)
A1c 5.4 today. BMI 24.9.

## 2019-05-26 NOTE — Assessment & Plan Note (Signed)
Check CBC and ferritin.  Patient denied hx of abnormal menstrual bleeding and hx of blood disorders in family.

## 2019-05-26 NOTE — Assessment & Plan Note (Addendum)
Unilateral chronic throbbing temporal headaches with nausea and vomiting. Etiology appears migrainous in nature. Mother died from cerebral aneurysm. No known hx of family kidney aneurysms. Will not persue imaging at this time. Consider prophylactic migraine medications if uncontrolled on OTC therapy.

## 2019-05-27 LAB — CBC WITH DIFFERENTIAL/PLATELET
Basophils Absolute: 0 10*3/uL (ref 0.0–0.2)
Basos: 1 %
EOS (ABSOLUTE): 0.1 10*3/uL (ref 0.0–0.4)
Eos: 1 %
Hematocrit: 38.2 % (ref 34.0–46.6)
Hemoglobin: 12.2 g/dL (ref 11.1–15.9)
Immature Grans (Abs): 0 10*3/uL (ref 0.0–0.1)
Immature Granulocytes: 0 %
Lymphocytes Absolute: 2.2 10*3/uL (ref 0.7–3.1)
Lymphs: 38 %
MCH: 26.2 pg — ABNORMAL LOW (ref 26.6–33.0)
MCHC: 31.9 g/dL (ref 31.5–35.7)
MCV: 82 fL (ref 79–97)
Monocytes Absolute: 0.4 10*3/uL (ref 0.1–0.9)
Monocytes: 6 %
Neutrophils Absolute: 3.1 10*3/uL (ref 1.4–7.0)
Neutrophils: 54 %
Platelets: 296 10*3/uL (ref 150–450)
RBC: 4.66 x10E6/uL (ref 3.77–5.28)
RDW: 13.2 % (ref 11.7–15.4)
WBC: 5.7 10*3/uL (ref 3.4–10.8)

## 2019-05-27 LAB — BASIC METABOLIC PANEL
BUN/Creatinine Ratio: 15 (ref 9–23)
BUN: 14 mg/dL (ref 6–20)
CO2: 23 mmol/L (ref 20–29)
Calcium: 10 mg/dL (ref 8.7–10.2)
Chloride: 102 mmol/L (ref 96–106)
Creatinine, Ser: 0.91 mg/dL (ref 0.57–1.00)
GFR calc Af Amer: 100 mL/min/{1.73_m2} (ref >59)
GFR calc non Af Amer: 87 mL/min/{1.73_m2} (ref >59)
Glucose: 80 mg/dL (ref 65–99)
Potassium: 4.4 mmol/L (ref 3.5–5.2)
Sodium: 138 mmol/L (ref 134–144)

## 2019-05-27 LAB — HIV ANTIBODY (ROUTINE TESTING W REFLEX): HIV Screen 4th Generation wRfx: NONREACTIVE

## 2019-05-27 LAB — FERRITIN: Ferritin: 34 ng/mL (ref 15–150)

## 2019-05-27 LAB — RPR: RPR Ser Ql: NONREACTIVE

## 2019-07-14 ENCOUNTER — Other Ambulatory Visit: Payer: Self-pay

## 2019-07-14 DIAGNOSIS — Z20828 Contact with and (suspected) exposure to other viral communicable diseases: Secondary | ICD-10-CM | POA: Diagnosis not present

## 2019-07-14 DIAGNOSIS — Z20822 Contact with and (suspected) exposure to covid-19: Secondary | ICD-10-CM

## 2019-07-16 LAB — NOVEL CORONAVIRUS, NAA: SARS-CoV-2, NAA: NOT DETECTED

## 2019-10-30 ENCOUNTER — Ambulatory Visit: Payer: Medicaid Other | Attending: Internal Medicine

## 2019-10-30 ENCOUNTER — Other Ambulatory Visit: Payer: Self-pay

## 2019-10-30 DIAGNOSIS — Z20822 Contact with and (suspected) exposure to covid-19: Secondary | ICD-10-CM

## 2019-10-31 LAB — NOVEL CORONAVIRUS, NAA: SARS-CoV-2, NAA: NOT DETECTED

## 2019-12-04 ENCOUNTER — Other Ambulatory Visit: Payer: Self-pay

## 2019-12-04 ENCOUNTER — Ambulatory Visit: Payer: Medicaid Other | Admitting: Family Medicine

## 2019-12-04 ENCOUNTER — Other Ambulatory Visit (HOSPITAL_COMMUNITY)
Admission: RE | Admit: 2019-12-04 | Discharge: 2019-12-04 | Disposition: A | Discharge status: Home / Self Care | Payer: Medicaid Other | Source: Ambulatory Visit | Attending: Family Medicine | Admitting: Family Medicine

## 2019-12-04 ENCOUNTER — Encounter: Payer: Self-pay | Admitting: Family Medicine

## 2019-12-04 VITALS — BP 100/68 | HR 89 | Ht 61.0 in | Wt 131.0 lb

## 2019-12-04 DIAGNOSIS — Z30011 Encounter for initial prescription of contraceptive pills: Secondary | ICD-10-CM

## 2019-12-04 DIAGNOSIS — Z7251 High risk heterosexual behavior: Secondary | ICD-10-CM | POA: Diagnosis not present

## 2019-12-04 DIAGNOSIS — N898 Other specified noninflammatory disorders of vagina: Secondary | ICD-10-CM | POA: Insufficient documentation

## 2019-12-04 DIAGNOSIS — Z114 Encounter for screening for human immunodeficiency virus [HIV]: Secondary | ICD-10-CM

## 2019-12-04 DIAGNOSIS — B9689 Other specified bacterial agents as the cause of diseases classified elsewhere: Secondary | ICD-10-CM | POA: Diagnosis not present

## 2019-12-04 DIAGNOSIS — N760 Acute vaginitis: Secondary | ICD-10-CM

## 2019-12-04 LAB — POCT URINALYSIS DIP (MANUAL ENTRY)
Bilirubin, UA: NEGATIVE
Blood, UA: NEGATIVE
Glucose, UA: NEGATIVE mg/dL
Ketones, POC UA: NEGATIVE mg/dL
Leukocytes, UA: NEGATIVE
Nitrite, UA: NEGATIVE
Protein Ur, POC: NEGATIVE mg/dL
Spec Grav, UA: >=1.03 — AB (ref 1.010–1.025)
Urobilinogen, UA: 0.2 E.U./dL
pH, UA: 6 (ref 5.0–8.0)

## 2019-12-04 LAB — POCT WET PREP (WET MOUNT)
Clue Cells Wet Prep Whiff POC: POSITIVE
Trichomonas Wet Prep HPF POC: ABSENT

## 2019-12-04 LAB — POCT URINE PREGNANCY: Preg Test, Ur: NEGATIVE

## 2019-12-04 MED ORDER — METRONIDAZOLE 500 MG PO TABS: 500.0000 mg | ORAL_TABLET | Freq: Two times a day (BID) | ORAL | 0 refills | Status: AC

## 2019-12-04 MED ORDER — NORGESTIMATE-ETH ESTRADIOL 0.25-35 MG-MCG PO TABS: 1.0000 | ORAL_TABLET | Freq: Every day | ORAL | 11 refills | Status: DC

## 2019-12-04 MED ORDER — METRONIDAZOLE 500 MG PO TABS: 500.0000 mg | ORAL_TABLET | Freq: Three times a day (TID) | ORAL | 0 refills | Status: DC

## 2019-12-04 NOTE — Progress Notes (Signed)
SUBJECTIVE:   CHIEF COMPLAINT / HPI:   CC: vaginal discharge   29 y.o. female complains of white and thin vaginal discharge for the past two weeks.. Denies abnormal vaginal bleeding or significant pelvic pain or fever. No UTI symptoms.Has no  known exposure to STD. Patient would like to discuss contraception today. Has history of trichomonas and BV. Patient had unprotected sex with a new partner on Wednesday (4/14).  Denies vaginal itching. Reports pelvic discomfort and fishy odor.   Patient's last menstrual period was 11/15/2019 (approximate). Has been taking two doses of Plan B in the past few weeks.       PERTINENT  PMH / PSH: hx of STI, high risk sexual activity   OBJECTIVE:   BP 100/68   Pulse 89   Ht 5\' 1"  (1.549 m)   Wt 131 lb (59.4 kg)   LMP 11/19/2019 (Approximate)   SpO2 98%   BMI 24.75 kg/m    General: She appears well, afebrile. Abdomen: benign, soft, nontender, no masses. Pelvic Exam: VULVA: normal appearing vulva with no masses, tenderness or lesions, VAGINA: vaginal discharge - white, malodorous and thin, CERVIX: normal appearing cervix with white discharge, lesions absent, UTERUS: uterus is normal size, shape, consistency and nontender, ADNEXA: normal adnexa in size, nontender and no masses, WET MOUNT done - results: DNA probe for chlamydia and GC obtained, exam chaperoned by CMA.  Results for orders placed or performed in visit on 12/04/19 (from the past 48 hour(s))  POCT urine pregnancy     Status: None   Collection Time: 12/04/19  4:25 PM  Result Value Ref Range   Preg Test, Ur Negative Negative  POCT urinalysis dipstick     Status: Abnormal   Collection Time: 12/04/19  4:25 PM  Result Value Ref Range   Color, UA yellow yellow   Clarity, UA cloudy (A) clear   Glucose, UA negative negative mg/dL   Bilirubin, UA negative negative   Ketones, POC UA negative negative mg/dL   Spec Grav, UA 12/06/19 (A) 1.010 - 1.025   Blood, UA negative negative   pH,  UA 6.0 5.0 - 8.0   Protein Ur, POC negative negative mg/dL   Urobilinogen, UA 0.2 0.2 or 1.0 E.U./dL   Nitrite, UA Negative Negative   Leukocytes, UA Negative Negative  POCT Wet Prep >=1.[address])     Status: Abnormal   Collection Time: 12/04/19  4:35 PM  Result Value Ref Range   Source Wet Prep POC VAG    WBC, Wet Prep HPF POC NONE    Bacteria Wet Prep HPF POC Few Few   Clue Cells Wet Prep HPF POC Few (A) None   Clue Cells Wet Prep Whiff POC Positive Whiff    Yeast Wet Prep HPF POC None None   KOH Wet Prep POC None None   Trichomonas Wet Prep HPF POC Absent Absent  HIV antibody (with reflex)     Status: None   Collection Time: 12/04/19  5:17 PM  Result Value Ref Range   HIV Screen 4th Generation wRfx Non Reactive Non Reactive  RPR     Status: None   Collection Time: 12/04/19  5:17 PM  Result Value Ref Range   RPR Ser Ql Non Reactive Non Reactive     ASSESSMENT/PLAN:   BV (bacterial vaginosis)  Treatment: Flagyl 500 BID x 7 days and abstain from coitus during course of treatment. Advised patient to not drink alcohol while taking this medication. Return if symptoms  persist or worsen.  High risk heterosexual behavior GC and chlamydia DNA  probe sent to lab. HIV and RPR collected. Advised patient to use barrier protection/condoms.   Education given regarding options for contraception, including barrier methods, injectable contraception, IUD placement, oral contraceptives. Patient choose OCPs.  The patient denies history of VTE. They have no history of hypertension or migraine with aura. They have no other contraindications for estrogen therapy.  - Start Ortho-cyclen      PAP to be completed at next visit.    Lyndee Hensen, [place]

## 2019-12-04 NOTE — Patient Instructions (Signed)
Bacterial Vaginosis  Bacterial vaginosis is an infection of the vagina. It happens when too many normal germs (healthy bacteria) grow in the vagina. This infection puts you at risk for infections from sex (STIs). Treating this infection can lower your risk for some STIs. You should also treat this if you are pregnant. It can cause your baby to be born early. Follow these instructions at home: Medicines  Take over-the-counter and prescription medicines only as told by your doctor.  Take or use your antibiotic medicine as told by your doctor. Do not stop taking or using it even if you start to feel better. General instructions  If you your sexual partner is a woman, tell her that you have this infection. She needs to get treatment if she has symptoms. If you have a female partner, he does not need to be treated.  During treatment: ? Avoid sex. ? Do not douche. ? Avoid alcohol as told. ? Avoid breastfeeding as told.  Drink enough fluid to keep your pee (urine) clear or pale yellow.  Keep your vagina and butt (rectum) clean. ? Wash the area with warm water every day. ? Wipe from front to back after you use the toilet.  Keep all follow-up visits as told by your doctor. This is important. Preventing this condition  Do not douche.  Use only warm water to wash around your vagina.  Use protection when you have sex. This includes: ? Latex condoms. ? Dental dams.  Limit how many people you have sex with. It is best to only have sex with the same person (be monogamous).  Get tested for STIs. Have your partner get tested.  Wear underwear that is cotton or lined with cotton.  Avoid tight pants and pantyhose. This is most important in summer.  Do not use any products that have nicotine or tobacco in them. These include cigarettes and e-cigarettes. If you need help quitting, ask your doctor.  Do not use illegal drugs.  Limit how much alcohol you drink. Contact a doctor if:  Your  symptoms do not get better, even after you are treated.  You have more discharge or pain when you pee (urinate).  You have a fever.  You have pain in your belly (abdomen).  You have pain with sex.  Your bleed from your vagina between periods. Summary  This infection happens when too many germs (bacteria) grow in the vagina.  Treating this condition can lower your risk for some infections from sex (STIs).  You should also treat this if you are pregnant. It can cause early (premature) birth.  Do not stop taking or using your antibiotic medicine even if you start to feel better. This information is not intended to replace advice given to you by your health care provider. Make sure you discuss any questions you have with your health care provider. Document Revised: 07/19/2017 Document Reviewed: 04/21/2016 Elsevier Patient Education  2020 [employer].  Oral Contraception Use Oral contraceptive pills (OCPs) are medicines that you take to prevent pregnancy. OCPs work by:  Preventing the ovaries from releasing eggs.  Thickening mucus in the lower part of the uterus (cervix), which prevents sperm from entering the uterus.  Thinning the lining of the uterus (endometrium), which prevents a fertilized egg from attaching to the endometrium. OCPs are highly effective when taken exactly as prescribed. However, OCPs do not prevent sexually transmitted infections (STIs). Safe sex practices, such as using condoms while on an OCP, can help prevent STIs. Before  taking OCPs, you may have a physical exam, blood test, and Pap test. A Pap test involves taking a sample of cells from your cervix to check for cancer. Discuss with your health care provider the possible side effects of the OCP you may be prescribed. When you start an OCP, be aware that it can take 2-3 months for your body to adjust to changes in hormone levels. How to take oral contraceptive pills Follow instructions from your health care  provider about how to start taking your first cycle of OCPs. Your health care provider may recommend that you:  Start the pill on day 1 of your menstrual period. If you start at this time, you will not need any backup form of birth control (contraception), such as condoms.  Start the pill on the first Sunday after your menstrual period or on the day you get your prescription. In these cases, you will need to use backup contraception for the first week.  Start the pill at any time of your cycle. ? If you take the pill within 5 days of the start of your period, you will not need a backup form of contraception. ? If you start at any other time of your menstrual cycle, you will need to use another form of contraception for 7 days. If your OCP is the type called a minipill, it will protect you from pregnancy after taking it for 2 days (48 hours), and you can stop using backup contraception after that time. After you have started taking OCPs:  If you forget to take 1 pill, take it as soon as you remember. Take the next pill at the regular time.  If you miss 2 or more pills, call your health care provider. Different pills have different instructions for missed doses. Use backup birth control until your next menstrual period starts.  If you use a 28-day pack that contains inactive pills and you miss 1 of the last 7 pills (pills with no hormones), throw away the rest of the non-hormone pills and start a new pill pack. No matter which day you start the OCP, you will always start a new pack on that same day of the week. Have an extra pack of OCPs and a backup contraceptive method available in case you miss some pills or lose your OCP pack. Follow these instructions at home:  Do not use any products that contain nicotine or tobacco, such as cigarettes and e-cigarettes. If you need help quitting, ask your health care provider.  Always use a condom to protect against STIs. OCPs do not protect against STIs.   Use a calendar to mark the days of your menstrual period.  Read the information and directions that came with your OCP. Talk to your health care provider if you have questions. Contact a health care provider if:  You develop nausea and vomiting.  You have abnormal vaginal discharge or bleeding.  You develop a rash.  You miss your menstrual period. Depending on the type of OCP you are taking, this may be a sign of pregnancy. Ask your health care provider for more information.  You are losing your hair.  You need treatment for mood swings or depression.  You get dizzy when taking the OCP.  You develop acne after taking the OCP.  You become pregnant or think you may be pregnant.  You have diarrhea, constipation, and abdominal pain or cramps.  You miss 2 or more pills. Get help right away if:  You  develop chest pain.  You develop shortness of breath.  You have an uncontrolled or severe headache.  You develop numbness or slurred speech.  You develop visual or speech problems.  You develop pain, redness, and swelling in your legs.  You develop weakness or numbness in your arms or legs. Summary  Oral contraceptive pills (OCPs) are medicines that you take to prevent pregnancy.  OCPs do not prevent sexually transmitted infections (STIs). Always use a condom to protect against STIs.  When you start an OCP, be aware that it can take 2-3 months for your body to adjust to changes in hormone levels.  Read all the information and directions that come with your OCP. This information is not intended to replace advice given to you by your health care provider. Make sure you discuss any questions you have with your health care provider. Document Revised: 11/28/2018 Document Reviewed: 09/17/2016 Elsevier Patient Education  2020 [employer].

## 2019-12-05 DIAGNOSIS — Z7251 High risk heterosexual behavior: Secondary | ICD-10-CM | POA: Insufficient documentation

## 2019-12-05 DIAGNOSIS — B9689 Other specified bacterial agents as the cause of diseases classified elsewhere: Secondary | ICD-10-CM | POA: Insufficient documentation

## 2019-12-05 LAB — HIV ANTIBODY (ROUTINE TESTING W REFLEX): HIV Screen 4th Generation wRfx: NONREACTIVE

## 2019-12-05 LAB — RPR: RPR Ser Ql: NONREACTIVE

## 2019-12-05 NOTE — Assessment & Plan Note (Signed)
Treatment: Flagyl 500 BID x 7 days and abstain from coitus during course of treatment. Advised patient to not drink alcohol while taking this medication. Return if symptoms persist or worsen.     

## 2019-12-05 NOTE — Assessment & Plan Note (Addendum)
GC and chlamydia DNA  probe sent to lab. HIV and RPR collected. Advised patient to use barrier protection/condoms.   Education given regarding options for contraception, including barrier methods, injectable contraception, IUD placement, oral contraceptives. Patient choose OCPs.  The patient denies history of VTE. They have no history of hypertension or migraine with aura. They have no other contraindications for estrogen therapy.  - Start Ortho-cyclen

## 2019-12-08 LAB — CERVICOVAGINAL ANCILLARY ONLY
Chlamydia: NEGATIVE
Comment: NEGATIVE
Comment: NORMAL
Neisseria Gonorrhea: NEGATIVE

## 2019-12-31 ENCOUNTER — Telehealth (INDEPENDENT_AMBULATORY_CARE_PROVIDER_SITE_OTHER): Payer: Medicaid Other | Admitting: Family Medicine

## 2019-12-31 ENCOUNTER — Other Ambulatory Visit: Payer: Self-pay

## 2019-12-31 ENCOUNTER — Encounter: Payer: Self-pay | Admitting: Family Medicine

## 2019-12-31 VITALS — Wt 133.0 lb

## 2019-12-31 DIAGNOSIS — B9689 Other specified bacterial agents as the cause of diseases classified elsewhere: Secondary | ICD-10-CM

## 2019-12-31 DIAGNOSIS — N760 Acute vaginitis: Secondary | ICD-10-CM

## 2019-12-31 MED ORDER — METRONIDAZOLE 500 MG PO TABS: 500.0000 mg | ORAL_TABLET | Freq: Two times a day (BID) | ORAL | 0 refills | Status: AC

## 2019-12-31 NOTE — Progress Notes (Signed)
[facility] Telemedicine Visit  Patient consented to have virtual visit. Method of visit: Video was attempted, but technology challenges prevented patient from using video, so visit was conducted via telephone.  Encounter participants: Patient: Ruth Howard - located at home Provider: Katha Cabal - located at [facility] Others (if applicable):   Chief Complaint: vaginal discharge  HPI:  Vaginal Discharge Vaginal Discharge - has been ongoing for >10 days  - Denies itching, burning, abdominal pain, nausea or vomiting. Denies burning with urination, hematuria. Denies fever, vaginal bleeding, pelvic pain and genital sores.  - Discharge described as thin white and malodorous.  - Patient reports hx of trich, chlamydia and gonorrhea in the past.  - Sexully active with multiple female partners, unprotected  - LMP was on Patient's last menstrual period was 12/24/2019., and was normal for patient.  - Patient reports  BV and yeast in the past.  - Patient denies douching.  - States she did not complete her recent Flagyl prescription for BV earlier last month    ROS: per HPI  Pertinent PMHx: hx of BV, yeast,  trich, chlamydia and gonorrhea   Exam:  Respiratory: Speaking in full sentences without pause.   Assessment/Plan:  BV (bacterial vaginosis)  Symptoms consistent with this and patient did not complete last prescription.   - Treatment: Flagyl 500 BID x 7 days and abstain from coitus during course of treatment. Advised patient to not drink alcohol while taking this medication.  - F/U if symptoms not improving or getting worse.  - Obtain G/C Chlamydia if sx not resolving.   - F/U with PCP as needed.  - Return precautions including abdominal pain, fever, chills, nausea, or vomiting given.         Time spent during visit with patient: 11 minutes

## 2019-12-31 NOTE — Patient Instructions (Signed)

## 2020-01-10 NOTE — Assessment & Plan Note (Addendum)
  Symptoms consistent with this and patient did not complete last prescription.   - Treatment: Flagyl 500 BID x 7 days and abstain from coitus during course of treatment. Advised patient to not drink alcohol while taking this medication.  - F/U if symptoms not improving or getting worse.  - Obtain G/C Chlamydia if sx not resolving.   - F/U with PCP as needed.  - Return precautions including abdominal pain, fever, chills, nausea, or vomiting given.

## 2020-02-17 ENCOUNTER — Ambulatory Visit: Payer: Medicaid Other | Admitting: Family Medicine

## 2020-02-17 NOTE — Progress Notes (Deleted)
    SUBJECTIVE:   CHIEF COMPLAINT / HPI:   VAGINAL DISCHARGE  Having vaginal discharge for *** days. Discharge consistency: *** Discharge color: *** Medications tried: ***  Recent antibiotic use: *** Sex in last month: *** Possible STD exposure:***  Symptoms Fever: *** Dysuria:*** Vaginal bleeding: *** Abdomen or Pelvic pain: *** Back pain: *** Genital sores or ulcers:*** Rash: *** Pain during sex: *** Missed menstrual period: ***  ***OCPS  PERTINENT  PMH / PSH: Anemia, anxiety/depression, high risk sexual behavior  OBJECTIVE:   There were no vitals taken for this visit.  ***  ASSESSMENT/PLAN:   No problem-specific Assessment & Plan notes found for this encounter.     Ellwood Dense, DO [facility]

## 2020-04-08 ENCOUNTER — Ambulatory Visit
Admission: RE | Admit: 2020-04-08 | Discharge: 2020-04-08 | Disposition: A | Discharge status: Home / Self Care | Payer: Medicaid Other | Source: Ambulatory Visit | Attending: Physician Assistant | Admitting: Physician Assistant

## 2020-04-08 ENCOUNTER — Other Ambulatory Visit: Payer: Self-pay

## 2020-04-08 VITALS — BP 111/80 | HR 82 | Temp 98.2°F | Resp 18

## 2020-04-08 DIAGNOSIS — N898 Other specified noninflammatory disorders of vagina: Secondary | ICD-10-CM | POA: Diagnosis present

## 2020-04-08 DIAGNOSIS — Z113 Encounter for screening for infections with a predominantly sexual mode of transmission: Secondary | ICD-10-CM | POA: Diagnosis not present

## 2020-04-08 LAB — POCT URINALYSIS DIP (MANUAL ENTRY)
Bilirubin, UA: NEGATIVE
Blood, UA: NEGATIVE
Glucose, UA: NEGATIVE mg/dL
Ketones, POC UA: NEGATIVE mg/dL
Leukocytes, UA: NEGATIVE
Nitrite, UA: NEGATIVE
Protein Ur, POC: NEGATIVE mg/dL
Spec Grav, UA: 1.025
Urobilinogen, UA: 0.2 U/dL
pH, UA: 6

## 2020-04-08 LAB — POCT URINE PREGNANCY: Preg Test, Ur: NEGATIVE

## 2020-04-08 NOTE — ED Provider Notes (Signed)
[facility]    CSN: [id number] Arrival date & time: 04/08/20  1141      History   Chief Complaint Chief Complaint  Patient presents with  . app 12 - STD    HPI Ruth Howard is a 29 y.o. female.   29 year old female comes in for vaginal discharge with odor x 2 weeks. Has also had urinary frequency, dysuria. Denies hematuria. Denies fever, abdominal pain. LMP 03/27/2020. Sexually active one female partner, no condom use.      Past Medical History:  Diagnosis Date  . Anemia   . Anxiety   . Depression     Patient Active Problem List   Diagnosis Date Noted  . BV (bacterial vaginosis) 12/05/2019  . High risk heterosexual behavior 12/05/2019  . Anemia 05/26/2019  . Health care maintenance 05/26/2019  . Anxiety and depression 05/26/2019  . Family history of diabetes mellitus (DM) 05/26/2019  . Family history of hypertension 05/26/2019  . Headache 05/26/2019    Past Surgical History:  Procedure Laterality Date  . CESAREAN SECTION     2016, 2019    OB History   No obstetric history on file.      Home Medications    Prior to Admission medications   Medication Sig Start Date End Date Taking? Authorizing Provider  acetaminophen (TYLENOL) 500 MG tablet Take 1 tablet (500 mg total) by mouth every 6 (six) hours as needed. 05/15/19   Georgetta Haber, NP  ibuprofen (ADVIL) 600 MG tablet Take 1 tablet (600 mg total) by mouth every 6 (six) hours as needed. 05/15/19   Georgetta Haber, NP    Family History Family History  Problem Relation Age of Onset  . Alcohol abuse Mother   . Drug abuse Mother   . Early death Mother   . Cerebral aneurysm Mother   . Diabetes Father   . Hypertension Father   . Stroke Father   . Asthma Sister   . Diabetes Sister     Social History Social History   Tobacco Use  . Smoking status: Former Smoker    Packs/day: 0.50    Years: 3.00    Pack years: 1.50    Types: Cigarettes  . Smokeless tobacco: Never Used  . Tobacco  comment: 01/2019  Substance Use Topics  . Alcohol use: Yes    Alcohol/week: 1.0 standard drink    Types: 1 Glasses of wine per week  . Drug use: Not Currently    Types: Oxycodone     Allergies   Morphine, Peanut oil, and Peanut-containing drug products   Review of Systems Review of Systems  Reason unable to perform ROS: See HPI as above.     Physical Exam Triage Vital Signs ED Triage Vitals  Enc Vitals Group     BP 04/08/20 1226 111/80     Pulse Rate 04/08/20 1226 82     Resp 04/08/20 1226 18     Temp 04/08/20 1226 98.2 F (36.8 C)     Temp Source 04/08/20 1226 Oral     SpO2 04/08/20 1226 98 %     Weight --      Height --      Head Circumference --      Peak Flow --      Pain Score 04/08/20 1227 0     Pain Loc --      Pain Edu? --      Excl. in GC? --    No data  found.  Updated Vital Signs BP 111/80 (BP Location: Left Arm)   Pulse 82   Temp 98.2 F (36.8 C) (Oral)   Resp 18   LMP 03/27/2020   SpO2 98%   Physical Exam Constitutional:      General: She is not in acute distress.    Appearance: She is well-developed. She is not ill-appearing, toxic-appearing or diaphoretic.  HENT:     Head: Normocephalic and atraumatic.  Eyes:     Conjunctiva/sclera: Conjunctivae normal.     Pupils: Pupils are equal, round, and reactive to light.  Cardiovascular:     Rate and Rhythm: Normal rate and regular rhythm.  Pulmonary:     Effort: Pulmonary effort is normal. No respiratory distress.     Comments: LCTAB Abdominal:     General: Bowel sounds are normal.     Palpations: Abdomen is soft.     Tenderness: There is no abdominal tenderness. There is no right CVA tenderness, left CVA tenderness, guarding or rebound.  Musculoskeletal:     Cervical back: Normal range of motion and neck supple.  Skin:    General: Skin is warm and dry.  Neurological:     Mental Status: She is alert and oriented to person, place, and time.  Psychiatric:        Behavior: Behavior normal.         Judgment: Judgment normal.      UC Treatments / Results  Labs (all labs ordered are listed, but only abnormal results are displayed) Labs Reviewed  HIV ANTIBODY (ROUTINE TESTING W REFLEX)  RPR  POCT URINE PREGNANCY  POCT URINALYSIS DIP (MANUAL ENTRY)    EKG   Radiology No results found.  Procedures Procedures (including critical care time)  Medications Ordered in UC Medications - No data to display  Initial Impression / Assessment and Plan / UC Course  I have reviewed the triage vital signs and the nursing notes.  Pertinent labs & imaging results that were available during my care of the patient were reviewed by me and considered in my medical decision making (see chart for details).    Urine preg/dipstick negative. Patient would like to wait for cytology prior to BV treatment. Cytology, HIV/RPR sent. Return precautions given.  Final Clinical Impressions(s) / UC Diagnoses   Final diagnoses:  Screen for STD (sexually transmitted disease)  Vaginal discharge    ED Prescriptions    None     PDMP not reviewed this encounter.   Belinda Fisher, PA-C 04/08/20 1304

## 2020-04-08 NOTE — ED Triage Notes (Signed)
Pt states her partners other partner has an STD and he told her to get checked. Pt states having a vaginal discharge with an odor x2wks.

## 2020-04-08 NOTE — Discharge Instructions (Addendum)
Urine negative for infection, pregnancy. Testing sent, you will be contacted with any positive results that requires further treatment. Refrain from sexual activity until results return. Monitor for any worsening of symptoms, fever, abdominal pain, nausea, vomiting, to follow up for reevaluation.

## 2020-04-09 LAB — RPR: RPR Ser Ql: NONREACTIVE

## 2020-04-09 LAB — HIV ANTIBODY (ROUTINE TESTING W REFLEX): HIV Screen 4th Generation wRfx: NONREACTIVE

## 2020-04-11 ENCOUNTER — Telehealth (HOSPITAL_COMMUNITY): Payer: Self-pay | Admitting: Emergency Medicine

## 2020-04-11 ENCOUNTER — Ambulatory Visit: Payer: Medicaid Other | Admitting: Family Medicine

## 2020-04-11 LAB — CERVICOVAGINAL ANCILLARY ONLY
Bacterial Vaginitis (gardnerella): POSITIVE — AB
Candida Glabrata: NEGATIVE
Candida Vaginitis: NEGATIVE
Chlamydia: NEGATIVE
Comment: NEGATIVE
Comment: NEGATIVE
Comment: NEGATIVE
Comment: NEGATIVE
Comment: NEGATIVE
Comment: NORMAL
Neisseria Gonorrhea: NEGATIVE
Trichomonas: NEGATIVE

## 2020-04-11 MED ORDER — METRONIDAZOLE 500 MG PO TABS: 500.0000 mg | ORAL_TABLET | Freq: Two times a day (BID) | ORAL | 0 refills | Status: DC

## 2020-10-25 ENCOUNTER — Ambulatory Visit (INDEPENDENT_AMBULATORY_CARE_PROVIDER_SITE_OTHER): Payer: Medicaid Other | Admitting: Family Medicine

## 2020-10-25 ENCOUNTER — Other Ambulatory Visit: Payer: Self-pay

## 2020-10-25 ENCOUNTER — Other Ambulatory Visit (HOSPITAL_COMMUNITY)
Admission: RE | Admit: 2020-10-25 | Discharge: 2020-10-25 | Disposition: A | Discharge status: Home / Self Care | Payer: Medicaid Other | Source: Ambulatory Visit | Attending: Family Medicine | Admitting: Family Medicine

## 2020-10-25 VITALS — BP 102/78 | HR 72

## 2020-10-25 DIAGNOSIS — Z7251 High risk heterosexual behavior: Secondary | ICD-10-CM | POA: Diagnosis not present

## 2020-10-25 DIAGNOSIS — B009 Herpesviral infection, unspecified: Secondary | ICD-10-CM | POA: Diagnosis not present

## 2020-10-25 MED ORDER — VALACYCLOVIR HCL 1 G PO TABS: 2000.0000 mg | ORAL_TABLET | Freq: Two times a day (BID) | ORAL | 3 refills | Status: AC

## 2020-10-25 MED ORDER — VALACYCLOVIR HCL 1 G PO TABS: 2000.0000 mg | ORAL_TABLET | Freq: Two times a day (BID) | ORAL | 2 refills | Status: DC

## 2020-10-25 NOTE — Progress Notes (Signed)
    SUBJECTIVE:   CHIEF COMPLAINT / HPI:   Sore throat and lip rash:  Patient states for the past 2 weeks she has had a burning itching rash on her lips.  Previously she noticed an ulcerated lesion on her lips but this has resolved.  Yesterday she had a sore throat but this resolved in 1 day.  Not currently take any medications for this pain.  She has not had any fevers during this time.  No cough or headaches other than her regular history of headaches.  She is currently sexually active with one female partner.  They have unprotected sex.  She states that at some point prior to her rash on her face she noticed that her boyfriend had a rash on his genitalia when she gave him oral sex.  She is not having any vaginal discharge or burning with urination.  Does not believe she has a UTI and does not want to give a urine sample.  Patient states she has never been diagnosed with herpes before.  Has never had an outbreak like this before.  PERTINENT  PMH / PSH: Headaches  OBJECTIVE:   BP 102/78   Pulse 72   LMP 09/28/2020 (Exact Date)   SpO2 99%   General: Alert and oriented.  No acute distress HEENT: PERRLA, EOMI.  Moist oral mucosa, no intraoral lesions appreciated.  No oral pharyngeal erythema.  Poor dentition with several missing molars.  No rash seen on the outer lips. CV: Regular rate and rhythm Pulmonary: Clear auscultation bilaterally Psych: Patient became tearful when she was told she may have an STI (herpes)  ASSESSMENT/PLAN:   Herpes Patient's description of burning on the lips with weeping ulcerated lesions along the vermilion border consistent with a oral herpes infection.  Oral exam was normal today.  Reassured patient that this is the most common STI and many people have this.  Discussed using Valtrex for outbreaks in the future.  Will not treat current outbreak as it appears to be resolving and would not add additional benefit.  Obtained oral swab for gonorrhea chlamydia.  Also got  blood testing for HIV, syphilis, hep C.     Sandre Kitty, MD [facility]

## 2020-10-25 NOTE — Patient Instructions (Addendum)
It was nice to meet you today,  He the lesions on your lips that you discuss bribed are likely due to oral herpes.  This is a common viral infection.  Treatment for this is with antivirals.  I have prescribed you an antiviral for the next time you get an outbreak.  It is less likely to be helpful for this outbreak, but can be used for next time.  I have also ordered some STI testing for you.  I will call you with the results of these when I get them.  I would recommend you discuss with your PCP restarting birth control.  Have a great day,  Ruth Jericho, MD  Valacyclovir caplets What is this medicine? VALACYCLOVIR (val ay SYE kloe veer) is an antiviral medicine. It is used to treat or prevent infections caused by certain kinds of viruses. Examples of these infections include herpes and shingles. This medicine will not cure herpes. This medicine may be used for other purposes; ask your health care provider or pharmacist if you have questions. COMMON BRAND NAME(S): Valtrex What should I tell my health care provider before I take this medicine? They need to know if you have any of these conditions:  acquired immunodeficiency syndrome (AIDS)  any other condition that may weaken the immune system  bone marrow or kidney transplant  kidney disease  an unusual or allergic reaction to valacyclovir, acyclovir, ganciclovir, valganciclovir, other medicines, foods, dyes, or preservatives  pregnant or trying to get pregnant  breast-feeding How should I use this medicine? Take this medicine by mouth with a glass of water. Follow the directions on the prescription label. You can take this medicine with or without food. Take your doses at regular intervals. Do not take your medicine more often than directed. Finish the full course prescribed by your doctor or health care professional even if you think your condition is better. Do not stop taking except on the advice of your doctor or health care  professional. Talk to your pediatrician regarding the use of this medicine in children. While this drug may be prescribed for children as young as 2 years for selected conditions, precautions do apply. Overdosage: If you think you have taken too much of this medicine contact a poison control center or emergency room at once. NOTE: This medicine is only for you. Do not share this medicine with others. What if I miss a dose? If you miss a dose, take it as soon as you can. If it is almost time for your next dose, take only that dose. Do not take double or extra doses. What may interact with this medicine? Do not take this medicine with any of the following medications:  cidofovir This medicine may also interact with the following medications:  adefovir  amphotericin B  certain antibiotics like amikacin, gentamicin, tobramycin, vancomycin  cimetidine  cisplatin  colistin  cyclosporine  foscarnet  lithium  methotrexate  probenecid  tacrolimus This list may not describe all possible interactions. Give your health care provider a list of all the medicines, herbs, non-prescription drugs, or dietary supplements you use. Also tell them if you smoke, drink alcohol, or use illegal drugs. Some items may interact with your medicine. What should I watch for while using this medicine? Tell your doctor or health care professional if your symptoms do not start to get better after 1 week. This medicine works best when taken early in the course of an infection, within the first 72 hours. Begin treatment as  soon as possible after the first signs of infection like tingling, itching, or pain in the affected area. It is possible that genital herpes may still be spread even when you are not having symptoms. Always use safer sex practices like condoms made of latex or polyurethane whenever you have sexual contact. You should stay well hydrated while taking this medicine. Drink plenty of fluids. What  side effects may I notice from receiving this medicine? Side effects that you should report to your doctor or health care professional as soon as possible:  allergic reactions like skin rash, itching or hives, swelling of the face, lips, or tongue  aggressive behavior  confusion  hallucinations  problems with balance, talking, walking  stomach pain  tremor  trouble passing urine or change in the amount of urine Side effects that usually do not require medical attention (report to your doctor or health care professional if they continue or are bothersome):  dizziness  headache  nausea, vomiting This list may not describe all possible side effects. Call your doctor for medical advice about side effects. You may report side effects to FDA at 1-800-FDA-1088. Where should I keep my medicine? Keep out of the reach of children. Store at room temperature between 15 and 25 degrees C (59 and 77 degrees F). Keep container tightly closed. Throw away any unused medicine after the expiration date. NOTE: This sheet is a summary. It may not cover all possible information. If you have questions about this medicine, talk to your doctor, pharmacist, or health care provider.  2021 Elsevier/Gold Standard (2018-09-02 12:22:33)

## 2020-10-26 DIAGNOSIS — B009 Herpesviral infection, unspecified: Secondary | ICD-10-CM | POA: Insufficient documentation

## 2020-10-26 LAB — RPR: RPR Ser Ql: NONREACTIVE

## 2020-10-26 LAB — CERVICOVAGINAL ANCILLARY ONLY
Chlamydia: NEGATIVE
Comment: NEGATIVE
Comment: NEGATIVE
Comment: NORMAL
Neisseria Gonorrhea: NEGATIVE
Trichomonas: NEGATIVE

## 2020-10-26 LAB — HEPATITIS C ANTIBODY: Hep C Virus Ab: 0.2 s/co ratio (ref 0.0–0.9)

## 2020-10-26 LAB — HIV ANTIBODY (ROUTINE TESTING W REFLEX): HIV Screen 4th Generation wRfx: NONREACTIVE

## 2020-10-26 NOTE — Assessment & Plan Note (Addendum)
Patient's description of burning on the lips with weeping ulcerated lesions along the vermilion border consistent with a oral herpes infection.  Oral exam was normal today.  Reassured patient that this is the most common STI and many people have this.  Discussed using Valtrex for outbreaks in the future.  Will not treat current outbreak as it appears to be resolving and would not add additional benefit.  Obtained oral swab for gonorrhea chlamydia.  Also got blood testing for HIV, syphilis, hep C.

## 2020-11-16 ENCOUNTER — Ambulatory Visit: Payer: Medicaid Other

## 2020-12-05 ENCOUNTER — Ambulatory Visit: Payer: Medicaid Other

## 2020-12-14 ENCOUNTER — Encounter: Payer: Self-pay | Admitting: Family Medicine

## 2020-12-14 ENCOUNTER — Other Ambulatory Visit: Payer: Self-pay

## 2020-12-14 ENCOUNTER — Ambulatory Visit (INDEPENDENT_AMBULATORY_CARE_PROVIDER_SITE_OTHER): Payer: Medicaid Other | Admitting: Family Medicine

## 2020-12-14 VITALS — BP 116/72 | Wt 145.0 lb

## 2020-12-14 DIAGNOSIS — R202 Paresthesia of skin: Secondary | ICD-10-CM | POA: Diagnosis not present

## 2020-12-14 DIAGNOSIS — R200 Anesthesia of skin: Secondary | ICD-10-CM

## 2020-12-14 NOTE — Patient Instructions (Signed)
Thank you for coming to see me today. It was a pleasure. Today we discussed your numbness in the spine. I do not know what is causing it. For now lets do some general lab work.  I recommend following up in 2 weeks with your PCP and keeping a diary of your symptoms at home. Please bring this diary to your next visit.  Please go to the ER if you develop worsening numbness, weakness in any of arms or legs, incontinence, unable to pass urine etc.   If you have any questions or concerns, please do not hesitate to call the office at [phone].  Best wishes,   Dr Allena Katz
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
## 2020-12-15 ENCOUNTER — Telehealth: Payer: Self-pay | Admitting: Family Medicine

## 2020-12-15 ENCOUNTER — Encounter: Payer: Self-pay | Admitting: Family Medicine

## 2020-12-15 DIAGNOSIS — R202 Paresthesia of skin: Secondary | ICD-10-CM | POA: Insufficient documentation

## 2020-12-15 DIAGNOSIS — R200 Anesthesia of skin: Secondary | ICD-10-CM | POA: Insufficient documentation

## 2020-12-15 LAB — CBC
Hematocrit: 38.6 % (ref 34.0–46.6)
Hemoglobin: 12.1 g/dL (ref 11.1–15.9)
MCH: 25.6 pg — ABNORMAL LOW (ref 26.6–33.0)
MCHC: 31.3 g/dL — ABNORMAL LOW (ref 31.5–35.7)
MCV: 82 fL (ref 79–97)
Platelets: 240 10*3/uL (ref 150–450)
RBC: 4.72 x10E6/uL (ref 3.77–5.28)
RDW: 13.2 % (ref 11.7–15.4)
WBC: 4.7 10*3/uL (ref 3.4–10.8)

## 2020-12-15 LAB — BASIC METABOLIC PANEL
BUN/Creatinine Ratio: 14 (ref 9–23)
BUN: 9 mg/dL (ref 6–20)
CO2: 23 mmol/L (ref 20–29)
Calcium: 9.8 mg/dL (ref 8.7–10.2)
Chloride: 100 mmol/L (ref 96–106)
Creatinine, Ser: 0.64 mg/dL (ref 0.57–1.00)
Glucose: 93 mg/dL (ref 65–99)
Potassium: 4.2 mmol/L (ref 3.5–5.2)
Sodium: 140 mmol/L (ref 134–144)
eGFR: 123 mL/min/{1.73_m2} (ref >59)

## 2020-12-15 LAB — VITAMIN B12: Vitamin B-12: 641 pg/mL (ref 232–1245)

## 2020-12-15 LAB — FOLATE: Folate: 12.3 ng/mL (ref >3.0)

## 2020-12-15 LAB — TSH: TSH: 0.558 u[IU]/mL (ref 0.450–4.500)

## 2020-12-15 NOTE — Assessment & Plan Note (Signed)
Unclear etiology of new back numbness with paraesthesia in upper and lower extremities. No clear pattern of symptoms. Broad differential including multiple sclerosis, fibromyalgia, GBS syndrome, demyelinating polyneuropathies  etc. Low suspicion for cauda equina or stroke based on history and physical. Could also be psychogenic as pt does have history of anxiety and depression however PHQ9 0 today. Precepted patient with Dr Deirdre Priest who recommended that we can hold off on imaging for now as no clear eitology and to obtain CBC, BMP, TSH, folate and b12. Recommended pt to keep a diary at home of her symptoms and follow up in 2 weeks with Dr Rachael Darby, consider imaging +/- neuro evaluation if no improvement in symptoms. ER precautions given to pt.
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
## 2020-12-29 ENCOUNTER — Encounter: Payer: Self-pay | Admitting: Family Medicine

## 2020-12-29 ENCOUNTER — Ambulatory Visit (INDEPENDENT_AMBULATORY_CARE_PROVIDER_SITE_OTHER): Payer: Medicaid Other | Admitting: Family Medicine

## 2020-12-29 ENCOUNTER — Other Ambulatory Visit: Payer: Self-pay

## 2020-12-29 VITALS — BP 102/70 | HR 90 | Ht 61.0 in | Wt 146.4 lb

## 2020-12-29 DIAGNOSIS — R202 Paresthesia of skin: Secondary | ICD-10-CM | POA: Diagnosis not present

## 2020-12-29 DIAGNOSIS — R200 Anesthesia of skin: Secondary | ICD-10-CM

## 2020-12-29 DIAGNOSIS — M546 Pain in thoracic spine: Secondary | ICD-10-CM

## 2020-12-29 LAB — POCT URINE PREGNANCY: Preg Test, Ur: NEGATIVE

## 2020-12-29 MED ORDER — GABAPENTIN 100 MG PO CAPS: 100.0000 mg | ORAL_CAPSULE | Freq: Three times a day (TID) | ORAL | 3 refills | Status: DC
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
## 2020-12-29 NOTE — Patient Instructions (Signed)
It was great seeing you today.  As discusssed, a referral was placed to neurology for your back pain and numbness. Be sure to stop by the pharmacy to pick up your Gabapentin. Take the first tablet at bedtime as it can make you sleepy. When you have time get your x-rays down.    I'd like to see you back 2-4 weeks if your pain does not improve but if you need to be seen earlier than that for any new issues we're happy to fit you in, just give [place] a call!    Take care,   [facility]

## 2020-12-30 ENCOUNTER — Ambulatory Visit
Admission: RE | Admit: 2020-12-30 | Discharge: 2020-12-30 | Disposition: A | Discharge status: Home / Self Care | Payer: Medicaid Other | Source: Ambulatory Visit | Attending: Family Medicine | Admitting: Family Medicine

## 2020-12-30 ENCOUNTER — Ambulatory Visit: Payer: Medicaid Other | Admitting: Family Medicine

## 2020-12-30 ENCOUNTER — Other Ambulatory Visit: Payer: Self-pay | Admitting: Family Medicine

## 2020-12-30 DIAGNOSIS — M546 Pain in thoracic spine: Secondary | ICD-10-CM

## 2020-12-30 DIAGNOSIS — R202 Paresthesia of skin: Secondary | ICD-10-CM

## 2020-12-30 NOTE — Assessment & Plan Note (Addendum)
Patient is a 30 year old female with mild anatomical paresthesia pattern.  Etiology unclear at this time.  Mild lower thoracic dextroscoliosis  noted on chest x-ray from 2017.  Continue over-the-counter analgesics.  Obtain CT and L-spine x-rays.  Red flag symptoms discussed and none identified.  Doubt cauda equina.  There is family history of autoimmune disease but symptom onset was abrupt.  Doubt multiple sclerosis. Possible cervical nerve compression. Previous laboratory work-up including CBC, BMP, TSH, folate and B12 was unremarkable.  ED precautions given.  Referral to neurology placed.

## 2021-01-03 ENCOUNTER — Ambulatory Visit: Payer: Medicaid Other | Admitting: Neurology

## 2021-01-03 ENCOUNTER — Encounter: Payer: Self-pay | Admitting: Neurology

## 2021-01-03 VITALS — BP 122/84 | HR 100 | Ht 61.0 in | Wt 148.5 lb

## 2021-01-03 DIAGNOSIS — M792 Neuralgia and neuritis, unspecified: Secondary | ICD-10-CM | POA: Diagnosis not present

## 2021-01-03 DIAGNOSIS — R202 Paresthesia of skin: Secondary | ICD-10-CM | POA: Diagnosis not present

## 2021-01-03 DIAGNOSIS — Z8249 Family history of ischemic heart disease and other diseases of the circulatory system: Secondary | ICD-10-CM | POA: Diagnosis not present

## 2021-01-03 DIAGNOSIS — R292 Abnormal reflex: Secondary | ICD-10-CM | POA: Insufficient documentation

## 2021-01-03 MED ORDER — DULOXETINE HCL 60 MG PO CPEP: 60.0000 mg | ORAL_CAPSULE | Freq: Every day | ORAL | 12 refills | Status: DC

## 2021-01-03 NOTE — Progress Notes (Signed)
Chief Complaint  Patient presents with  . New Patient (Initial Visit)    Reports thoracic back pain that radiates up and down her back. Numbness/tingling in back, bilateral arms (worse on left), bilateral legs and feet. She is using gabapentin 100mg , TID. The medication has not been beneficial. She had recent x-rays of cervical, thoracic and lumbar spine. She has scoliosis.      ASSESSMENT AND PLAN  Ruth Howard is a 30 y.o. female   Acute onset of paresthesia  On examination, she has hyperreflexia of bilateral lower extremity, allodynia of left C5-6-7, lumbar sacral myotomes,  Need to rule out cervical, thoracic spine lesions, Family history of aneurysm,  Her mother died of sudden death from brain aneurysm in her 30s,  MRI of the brain, and MRA of the brain   DIAGNOSTIC DATA (LABS, IMAGING, TESTING) - I reviewed patient records, labs, notes, testing and imaging myself where available.  Laboratory evaluation May 2022: Normal vitamin B12, folic acid, TSH, BMP, hepatitis C antibody, RPR, HIV, CBC showed hemoglobin of 12.1,  HISTORICAL  Ruth Howard is a 30 year old female, seen in request by her primary care doctor 26, for evaluation of sudden onset bilateral lower extremity, left upper extremity paresthesia, initial evaluation was on Jan 03, 2021  I reviewed and summarized the referring note. PMHx. Past medical history of scoliosis, Depression Her mother died of age 76 from sudden death due to brain aneurysm  In April 2022, without clear triggers, she had achy pain developed at mid thoracic level, also radiating paresthesia along her spine rostrally to neck, caudally to bilateral lower extremity, at the same time she felt radiating pain to her left arm, skin was hypersensitive to touch  Slow worsening symptoms, now unpleasant achy paresthesia present 50% of her daytime, it is hard for her to find a comfortable position, she has to keep on changing her body  position, even pace around, she will often describe needles pinprick sensation traveling down her left arm, bilateral lower extremity,  She denies gait abnormality, denies bowel bladder incontinence,  She was started on gabapentin 100 mg 3 times daily, did not help her symptoms, over the past few weeks, also developed intermittent painful muscle spasm,   PHYSICAL EXAM:   Vitals:   01/03/21 0829  BP: 122/84  Pulse: 100  Weight: 148 lb 8 oz (67.4 kg)  Height: 5\' 1"  (1.549 m)   Not recorded     Body mass index is 28.06 kg/m.  PHYSICAL EXAMNIATION:  Gen: NAD, conversant, well nourised, well groomed                     Cardiovascular: Regular rate rhythm, no peripheral edema, warm, nontender. Eyes: Conjunctivae clear without exudates or hemorrhage Neck: Supple, no carotid bruits. Pulmonary: Clear to auscultation bilaterally   NEUROLOGICAL EXAM:  MENTAL STATUS: Speech:    Speech is normal; fluent and spontaneous with normal comprehension.  Cognition:     Orientation to time, place and person     Normal recent and remote memory     Normal Attention span and concentration     Normal Language, naming, repeating,spontaneous speech     Fund of knowledge   CRANIAL NERVES: CN II: Visual fields are full to confrontation. Pupils are round equal and briskly reactive to light. CN III, IV, VI: extraocular movement are normal. No ptosis. CN V: Facial sensation is intact to light touch CN VII: Face is symmetric with normal eye closure  CN  VIII: Hearing is normal to causal conversation. CN IX, X: Phonation is normal. CN XI: Head turning and shoulder shrug are intact  MOTOR: There is no pronator drift of out-stretched arms. Muscle bulk and tone are normal. Muscle strength is normal.  REFLEXES: Reflexes are 2+ and symmetric at the biceps, triceps, 3/3 knees, and ankles. Plantar responses are flexor.  SENSORY: Intact to light touch, pinprick and vibratory sensation are intact in  fingers and toes.  COORDINATION: There is no trunk or limb dysmetria noted.  GAIT/STANCE: Posture is normal. Gait is steady with normal steps, base, arm swing, and turning. Heel and toe walking are normal. Tandem gait is normal.  Romberg is absent.  REVIEW OF SYSTEMS:  Full 14 system review of systems performed and notable only for as above All other review of systems were negative.   ALLERGIES: Allergies  Allergen Reactions  . Morphine Itching  . Peanut Oil Other (See Comments)    Throat swelling  . Peanut-Containing Drug Products     HOME MEDICATIONS: Current Outpatient Medications  Medication Sig Dispense Refill  . gabapentin (NEURONTIN) 100 MG capsule Take 1 capsule (100 mg total) by mouth 3 (three) times daily. 90 capsule 3  . valACYclovir (VALTREX) 1000 MG tablet Take 2 g by mouth as needed.     No current facility-administered medications for this visit.    PAST MEDICAL HISTORY: Past Medical History:  Diagnosis Date  . Anemia   . Anxiety   . Depression   . Scoliosis   . Thoracic back pain     PAST SURGICAL HISTORY: Past Surgical History:  Procedure Laterality Date  . CESAREAN SECTION     2016, 2019    FAMILY HISTORY: Family History  Problem Relation Age of Onset  . Alcohol abuse Mother   . Drug abuse Mother   . Early death Mother   . Cerebral aneurysm Mother   . Diabetes Father   . Hypertension Father   . Stroke Father   . Asthma Sister   . Diabetes Sister     SOCIAL HISTORY: Social History   Socioeconomic History  . Marital status: Single    Spouse name: Not on file  . Number of children: 4  . Years of education: currently in college  . Highest education level: Not on file  Occupational History  . Occupation: home health aid    Employer: [employer]  Tobacco Use  . Smoking status: Former Smoker    Packs/day: 0.50    Years: 3.00    Pack years: 1.50    Types: Cigarettes  . Smokeless tobacco: Never Used  . Tobacco  comment: 01/2019  Substance and Sexual Activity  . Alcohol use: Yes    Comment: social - rare  . Drug use: Not Currently    Types: Oxycodone  . Sexual activity: Yes    Birth control/protection: None  Other Topics Concern  . Not on file  Social History Narrative   Lives at home with her children.   Right-handed.   No daily use of caffeine.   Social Determinants of Health   Financial Resource Strain: Not on file  Food Insecurity: Not on file  Transportation Needs: Not on file  Physical Activity: Not on file  Stress: Not on file  Social Connections: Not on file  Intimate Partner Violence: Not on file      Levert Feinstein, M.D. Ph.D.  [facility] [address] [place], [place] [address] Ph: [phone] Fax: [phone]  [phone]  CC:  Katha Cabal, DO [address] [place],  [place] [address]  Katha Cabal, DO

## 2021-01-13 ENCOUNTER — Ambulatory Visit
Admission: RE | Admit: 2021-01-13 | Discharge: 2021-01-13 | Disposition: A | Discharge status: Home / Self Care | Payer: Medicaid Other | Source: Ambulatory Visit | Attending: Neurology | Admitting: Neurology

## 2021-01-13 ENCOUNTER — Other Ambulatory Visit: Payer: Self-pay

## 2021-01-13 DIAGNOSIS — R202 Paresthesia of skin: Secondary | ICD-10-CM

## 2021-01-13 DIAGNOSIS — Z8249 Family history of ischemic heart disease and other diseases of the circulatory system: Secondary | ICD-10-CM | POA: Diagnosis not present

## 2021-01-13 DIAGNOSIS — M792 Neuralgia and neuritis, unspecified: Secondary | ICD-10-CM

## 2021-01-13 MED ORDER — GADOBENATE DIMEGLUMINE 529 MG/ML IV SOLN
14.0000 mL | Freq: Once | INTRAVENOUS | Status: AC | PRN
  Administered 2021-01-13: 14 mL via INTRAVENOUS

## 2021-01-19 ENCOUNTER — Ambulatory Visit
Admission: RE | Admit: 2021-01-19 | Discharge: 2021-01-19 | Disposition: A | Discharge status: Home / Self Care | Payer: Medicaid Other | Source: Ambulatory Visit | Attending: Neurology | Admitting: Neurology

## 2021-01-19 ENCOUNTER — Other Ambulatory Visit: Payer: Self-pay

## 2021-01-19 DIAGNOSIS — R202 Paresthesia of skin: Secondary | ICD-10-CM

## 2021-01-19 DIAGNOSIS — Z8249 Family history of ischemic heart disease and other diseases of the circulatory system: Secondary | ICD-10-CM

## 2021-01-19 DIAGNOSIS — M792 Neuralgia and neuritis, unspecified: Secondary | ICD-10-CM

## 2021-01-19 MED ORDER — GADOBENATE DIMEGLUMINE 529 MG/ML IV SOLN
14.0000 mL | Freq: Once | INTRAVENOUS | Status: AC | PRN
  Administered 2021-01-19: 14 mL via INTRAVENOUS

## 2021-01-24 ENCOUNTER — Telehealth: Payer: Self-pay | Admitting: *Deleted

## 2021-01-24 NOTE — Telephone Encounter (Signed)
I spoke to the patient and notified her of the results. She will continue her medication and keep her pending follow up.

## 2021-01-24 NOTE — Telephone Encounter (Signed)
-----   Message from Suanne Marker, MD sent at 01/23/2021 12:04 PM EDT ----- Unremarkable imaging results. Please call patient. Continue current plan. -VRP

## 2021-01-24 NOTE — Telephone Encounter (Signed)
-----   Message from Suanne Marker, MD sent at 01/23/2021 12:04 PM EDT ----- Unremarkable study. No major findings. Please call patient. Continue current plan. -VRP

## 2021-01-24 NOTE — Telephone Encounter (Signed)
I spoke to the patient and notified her of the results. She will continue her medication and keep her pending follow up. 

## 2021-02-06 ENCOUNTER — Ambulatory Visit: Payer: Medicaid Other | Admitting: Family Medicine

## 2021-03-08 NOTE — Progress Notes (Addendum)
Virtual Visit via Video Note  I connected with Ruth Howard on 03/09/21 at  2:45 PM EDT by a video enabled telemedicine application and verified that I am speaking with the correct person using two identifiers.  Location: Patient: in her car Provider: in the office    I discussed the limitations of evaluation and management by telemedicine and the availability of in person appointments. The patient expressed understanding and agreed to proceed.  History of Present Illness: Ruth Howard is a 30 year old female, seen in request by her primary care doctor Katha Cabal, for evaluation of sudden onset bilateral lower extremity, left upper extremity paresthesia, initial evaluation was on Jan 03, 2021   I reviewed and summarized the referring note. PMHx. Past medical history of scoliosis, Depression Her mother died of age 1 from sudden death due to brain aneurysm   In April 2022, without clear triggers, she had achy pain developed at mid thoracic level, also radiating paresthesia along her spine rostrally to neck, caudally to bilateral lower extremity, at the same time she felt radiating pain to her left arm, skin was hypersensitive to touch   Slow worsening symptoms, now unpleasant achy paresthesia present 50% of her daytime, it is hard for her to find a comfortable position, she has to keep on changing her body position, even pace around, she will often describe needles pinprick sensation traveling down her left arm, bilateral lower extremity,   She denies gait abnormality, denies bowel bladder incontinence,   She was started on gabapentin 100 mg 3 times daily, did not help her symptoms, over the past few weeks, also developed intermittent painful muscle spasm,  Update March 09, 2021 SS: Reports still has numbness and tingling to left arm and legs, mid back pins and needles. Started in her mid back feeling numb, the pain shoots down her legs, it lasts for hours, remains on Cymbalta 60  mg daily, gabapentin 100 mg 3 times daily. Her grandmother and great grandmother died from MS (new info to her). 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 Wants to know why her back goes numb which is main symptom. Symptoms are every other day. Shooting pain is no longer in the left arm, just in the legs.  Urinary urgency and frequency. She changed her in office appointment to virtual due to work.  Extensive imaging studies were unremarkable: -MRI of the brain with and without contrast was normal -MRA head was unremarkable, no aneurysms were noted -MRI of the cervical spine with and without contrast were normal, very minimal disc bulge at C2-3, C5-6 that did not lead to any spinal stenosis or nerve root compression -MRI of the thoracic spine with and without contrast was normal   Observations/Objective: In her car, alert and oriented, speech is clear and concise, facial symmetry noted, was raining outside couldn't ambulate for exam  Assessment and Plan: Acute onset of paresthesia  2.   Family history of aneurysm   -Extensive imaging was unremarkable -MRI of the brain with and without contrast was normal -MRA head was unremarkable, no aneurysms were noted -MRI of the cervical spine with and without contrast were normal, very minimal disc bulge at C2-3, C5-6 that did not lead to any spinal stenosis or nerve root compression -MRI of the thoracic spine with and without contrast was normal  -No etiology for her reported numbness to her thoracic spine, shooting pains down her legs, this was virtual visit, in office visit would be desirable  -Will discuss with Dr.  Terrace Arabia if further work-up is needed given her continued symptoms despite the Cymbalta, gabapentin, only improvement is no longer symptoms to left arm   Follow Up Instructions: Will review case with Dr. Terrace Arabia determine if follow-up is needed    I discussed the assessment and treatment plan with the patient. The patient was provided an opportunity to ask  questions and all were answered. The patient agreed with the plan and demonstrated an understanding of the instructions.   The patient was advised to call back or seek an in-person evaluation if the symptoms worsen or if the condition fails to improve as anticipated.  I spent 15 minutes dedicated to the care of this patient on the date of this encounter to include previsit review of past office visit, MRI reports, face-to-face time with the patient, and postvisit documentation.   Otila Kluver, DNP  [facility] [address] [place], [place] [address] [phone]  Addendum: Chart reviewed, addendum, chart reviewed, also extensive MRI brain, angiogram of the head, cervical thoracic spine failed to demonstrate significant abnormality  Previous in person examination showed brisk reflex, less likely indicate peripheral nervous system etiology, we have completed neurology evaluation, she is to continue follow-up with her primary care,  03/15/21 SS: Please see Dr. Terrace Arabia above comments, will reach out to the patient, continue follow up with PCP.

## 2021-03-09 ENCOUNTER — Telehealth: Payer: Self-pay | Admitting: Neurology

## 2021-03-09 ENCOUNTER — Encounter: Payer: Self-pay | Admitting: Neurology

## 2021-03-09 ENCOUNTER — Telehealth (INDEPENDENT_AMBULATORY_CARE_PROVIDER_SITE_OTHER): Payer: Medicaid Other | Admitting: Neurology

## 2021-03-09 DIAGNOSIS — Z8249 Family history of ischemic heart disease and other diseases of the circulatory system: Secondary | ICD-10-CM

## 2021-03-09 DIAGNOSIS — R202 Paresthesia of skin: Secondary | ICD-10-CM

## 2021-03-09 NOTE — Telephone Encounter (Signed)
..   Pt understands that although there may be some limitations with this type of visit, we will take all precautions to reduce any security or privacy concerns.  Pt understands that this will be treated like an in office visit and we will file with pt's insurance, and there may be a patient responsible charge related to this service. ? ?

## 2021-03-15 NOTE — Progress Notes (Signed)
I called the patient and reviewed the additional information from Dr. Terrace Arabia. The patient is in agreement to follow up with her PCP.

## 2021-03-21 ENCOUNTER — Other Ambulatory Visit: Payer: Self-pay

## 2021-03-21 ENCOUNTER — Ambulatory Visit (INDEPENDENT_AMBULATORY_CARE_PROVIDER_SITE_OTHER): Payer: Medicaid Other | Admitting: Family Medicine

## 2021-03-21 VITALS — BP 115/75 | HR 95 | Wt 148.0 lb

## 2021-03-21 DIAGNOSIS — R300 Dysuria: Secondary | ICD-10-CM

## 2021-03-21 LAB — POCT URINALYSIS DIP (MANUAL ENTRY)
Bilirubin, UA: NEGATIVE
Blood, UA: NEGATIVE
Glucose, UA: NEGATIVE mg/dL
Ketones, POC UA: NEGATIVE mg/dL
Nitrite, UA: NEGATIVE
Protein Ur, POC: NEGATIVE mg/dL
Spec Grav, UA: 1.025 (ref 1.010–1.025)
Urobilinogen, UA: 0.2 E.U./dL
pH, UA: 6 (ref 5.0–8.0)

## 2021-03-21 MED ORDER — PHENAZOPYRIDINE HCL 100 MG PO TABS: 200.0000 mg | ORAL_TABLET | Freq: Three times a day (TID) | ORAL | 0 refills | Status: AC | PRN

## 2021-03-21 NOTE — Progress Notes (Signed)
    SUBJECTIVE:   CHIEF COMPLAINT / HPI:   Concern for UTI: Patient is a 30 year old female presenting today out of concern for urinary tract infection. Started 2-3 weeks ago, has had some burning with urination and change in urine smell. She has had an increase in urinary frequency as well.  She states that she has some occasional headaches as well.  Has also had some back pain.  She states that she has no concern for pregnancy as she just had her period.  PERTINENT  PMH / PSH: None  OBJECTIVE:   BP 115/75   Pulse 95   Wt 148 lb (67.1 kg)   SpO2 100%   BMI 27.96 kg/m    General: NAD, pleasant, able to participate in exam Cardiac: RRR, no murmurs. Respiratory: CTAB, normal effort, No wheezes, rales or rhonchi Abdomen: No suprapubic discomfort, small discomfort when performing CVA on the right, however this seems to be more musculoskeletal upon further palpation Psych: Normal affect and mood  ASSESSMENT/PLAN:   Dysuria Symptoms of dysuria and some urinary frequency over the past 3 weeks..  Urinalysis shows small leukocytes, negative nitrite.  Patient also with some minor CVA discomfort on initial physical exam but upon further palpation of the seems to be musculoskeletal.  With her current urinalysis I have no concerns for pyelonephritis.  Do not believe she is in need of antibiotics at this time as her urinalysis is  not consistent with a urinary tract infection other than small leukocytes.  Discussed case with Dr. Perley Jain and will initiate a few days of Pyridium to see if this improves her symptoms.  Follow-up in 1 week if symptoms not improve.  Jackelyn Poling, DO [facility]

## 2021-03-21 NOTE — Patient Instructions (Signed)
Your urinalysis did not look like a urinary tract infection.  You can sometimes get symptoms of urinary tract infection without actual bacteria and we can sometimes use a medication called Pyridium that can help with this.  I have sent this to your pharmacy.  You will take this 3 times per day for 2 days.  If your symptoms are still present in 1 week I would like for you to return in case you are in the early stages of developing a urinary tract infection, however since this is been going on for a few weeks I do not believe this to be the case and this should improve your symptoms.

## 2021-03-24 ENCOUNTER — Ambulatory Visit: Payer: Medicaid Other | Admitting: Neurology

## 2021-04-05 ENCOUNTER — Ambulatory Visit: Payer: Medicaid Other | Admitting: Family Medicine

## 2021-04-14 ENCOUNTER — Ambulatory Visit: Payer: Medicaid Other | Admitting: Family Medicine

## 2021-04-26 ENCOUNTER — Other Ambulatory Visit: Payer: Self-pay

## 2021-04-26 ENCOUNTER — Ambulatory Visit (INDEPENDENT_AMBULATORY_CARE_PROVIDER_SITE_OTHER): Payer: Medicaid Other | Admitting: Family Medicine

## 2021-04-26 VITALS — BP 97/66 | HR 95 | Ht 61.0 in | Wt 148.0 lb

## 2021-04-26 DIAGNOSIS — N390 Urinary tract infection, site not specified: Secondary | ICD-10-CM

## 2021-04-26 DIAGNOSIS — Z5321 Procedure and treatment not carried out due to patient leaving prior to being seen by health care provider: Secondary | ICD-10-CM

## 2021-04-26 NOTE — Progress Notes (Signed)
   SUBJECTIVE:   CHIEF COMPLAINT / HPI:     Ruth Howard is a 30 y.o. female here for UTI sx however left prior to being seen as she needed to pick up her son.     OBJECTIVE:   BP 97/66   Pulse 95   Ht 5\' 1"  (1.549 m)   Wt 148 lb (67.1 kg)   LMP 04/03/2021   SpO2 100%   BMI 27.96 kg/m      ASSESSMENT/PLAN:   No problem-specific Assessment & Plan notes found for this encounter.

## 2021-05-03 NOTE — Progress Notes (Signed)
Chart reviewed, agree above plan ?

## 2021-05-24 NOTE — Progress Notes (Signed)
    SUBJECTIVE:   CHIEF COMPLAINT / HPI:   Concern for UTI plus vaginal discharge: 30 year old female presenting for concern that she may have urinary tract infection.  She states she also has some vaginal discharge.  She states vaginal discharge is been going on for several weeks and that her urinary symptoms never really resolved from her previous appointment she abalone back in August.  At that time her urinalysis was essentially normal and so we started Pyridium with no indication for antibiotics.  She states that her vaginal discharge started several weeks after this but does not seem to occur every day.  She also complains of some discomfort in the vaginal area.  PERTINENT  PMH / PSH: None relevant  OBJECTIVE:   Ht 5\' 1"  (1.549 m)   Wt 149 lb 9.6 oz (67.9 kg)   LMP 05/08/2021   BMI 28.27 kg/m    General: NAD, pleasant, able to participate in exam Cardiac: RRR, no murmurs. Respiratory: CTAB, normal effort, No wheezes, rales or rhonchi Abdomen: Bowel sounds present, nontender, nondistended, no suprapubic discomfort. Pelvic exam: VULVA: normal appearing vulva with no masses, tenderness or lesions, VAGINA: normal appearing vagina but with thick whitish discharge present, CERVIX: normal appearing cervix without discharge or lesions.  Chaperone: April Neuro: alert, no obvious focal deficits Psych: Normal affect and mood  ASSESSMENT/PLAN:    Concern for UTI and vaginal discharge: 30 year old female presenting with dysuria and vaginal discharge for several weeks.  She was previously seen by myself on 03/21/2021 for the same complaint and had a urinalysis which was essentially normal at that time and was recommended to start Pyridium.  Urinalysis today shows trace leukocytes and cloudy urine but otherwise normal with no nitrite.  She does not have suprapubic discomfort on physical exam.  Wet prep performed today shows bacterial vaginosis.  We will treat with metronidazole.  She endorses  some vaginal discomfort. Plan: -We will check urine culture as patient does have trace leukocytes and cloudy urine but it is otherwise normal.  If this shows significant growth we will treat as UTI. -We will perform her Pap smear and check for GC/chlamydia -Wet prep as above-we will treat with metronidazole -We will test for STDs including HIV and RPR  05/21/2021, DO [facility]

## 2021-05-26 ENCOUNTER — Ambulatory Visit (INDEPENDENT_AMBULATORY_CARE_PROVIDER_SITE_OTHER): Payer: Medicaid Other | Admitting: Family Medicine

## 2021-05-26 ENCOUNTER — Other Ambulatory Visit (HOSPITAL_COMMUNITY)
Admission: RE | Admit: 2021-05-26 | Discharge: 2021-05-26 | Disposition: A | Discharge status: Home / Self Care | Payer: Medicaid Other | Source: Ambulatory Visit | Attending: Family Medicine | Admitting: Family Medicine

## 2021-05-26 ENCOUNTER — Other Ambulatory Visit: Payer: Self-pay

## 2021-05-26 VITALS — BP 108/73 | HR 96 | Ht 61.0 in | Wt 149.6 lb

## 2021-05-26 DIAGNOSIS — Z124 Encounter for screening for malignant neoplasm of cervix: Secondary | ICD-10-CM

## 2021-05-26 DIAGNOSIS — N898 Other specified noninflammatory disorders of vagina: Secondary | ICD-10-CM | POA: Diagnosis not present

## 2021-05-26 DIAGNOSIS — R300 Dysuria: Secondary | ICD-10-CM

## 2021-05-26 LAB — POCT URINALYSIS DIP (MANUAL ENTRY)
Bilirubin, UA: NEGATIVE
Blood, UA: NEGATIVE
Glucose, UA: NEGATIVE mg/dL
Ketones, POC UA: NEGATIVE mg/dL
Nitrite, UA: NEGATIVE
Protein Ur, POC: NEGATIVE mg/dL
Spec Grav, UA: 1.025 (ref 1.010–1.025)
Urobilinogen, UA: 0.2 E.U./dL
pH, UA: 6.5 (ref 5.0–8.0)

## 2021-05-26 LAB — POCT WET PREP (WET MOUNT)
Clue Cells Wet Prep Whiff POC: POSITIVE
Trichomonas Wet Prep HPF POC: ABSENT

## 2021-05-26 LAB — POCT UA - MICROSCOPIC ONLY: Epithelial cells, urine per micros: >20

## 2021-05-26 MED ORDER — METRONIDAZOLE 500 MG PO TABS: 500.0000 mg | ORAL_TABLET | Freq: Two times a day (BID) | ORAL | 0 refills | Status: AC

## 2021-05-26 NOTE — Patient Instructions (Signed)
Today we saw you for concern of UTI and vaginal discharge.  We performed a wet prep which showed bacterial vaginosis.  We are prescribing metronidazole for you to take for the next 7 days.  This should improve your symptoms.  If it does not please let us know.  We are still waiting on the gonorrhea/chlamydia as well as the results of your Pap smear and blood work including HIV and RPR.  I will let you know the results of these.  If they are normal I will send a MyChart message.  If they are not normal I will give you a call.

## 2021-05-27 LAB — HIV ANTIBODY (ROUTINE TESTING W REFLEX): HIV Screen 4th Generation wRfx: NONREACTIVE

## 2021-05-27 LAB — RPR: RPR Ser Ql: NONREACTIVE

## 2021-05-29 LAB — CYTOLOGY - PAP
Adequacy: ABSENT
Chlamydia: NEGATIVE
Comment: NEGATIVE
Comment: NORMAL
Diagnosis: NEGATIVE
Neisseria Gonorrhea: NEGATIVE

## 2021-05-29 LAB — URINE CULTURE

## 2021-05-31 ENCOUNTER — Other Ambulatory Visit: Payer: Self-pay | Admitting: Family Medicine

## 2021-06-09 ENCOUNTER — Ambulatory Visit: Payer: Medicaid Other

## 2021-06-09 NOTE — Progress Notes (Deleted)
    SUBJECTIVE:   CHIEF COMPLAINT / HPI:   Vaginal Discharge: Patient is a 30 y.o. female presenting with vaginal discharge for *** days.  She states the discharge is of *** consistency.  She endorses *** vaginal odor.  She is interested in screening for sexually transmitted infections today.  PERTINENT  PMH / PSH: ***None relevant  OBJECTIVE:   There were no vitals taken for this visit.   General: NAD, pleasant, able to participate in exam Respiratory: Normal effort, no obvious respiratory distress Pelvic: VULVA: normal appearing vulva with no masses, tenderness or lesions, VAGINA: Normal appearing vagina with normal color, no lesions, with {GYN VAGINAL DISCHARGE:21986} discharge present, ***CERVIX: No lesions, {GYN VAGINAL DISCHARGE:21986} discharge present,  Chaperone *** present for pelvic exam  ASSESSMENT/PLAN:   No problem-specific Assessment & Plan notes found for this encounter.    Assessment:  30 y.o. female with vaginal discharge for***days, as well as***.  Physical exam significant for*** discharge.  Wet prep performed today shows *** consistent with ***.  Patient is interested in STI screening.   Plan: -Wet prep as above.  Will treat with***. -GC/chlamydia pending -Will check HIV and RPR  Jackelyn Poling, DO [facility]

## 2021-06-12 ENCOUNTER — Ambulatory Visit: Payer: Medicaid Other

## 2021-06-14 ENCOUNTER — Emergency Department (HOSPITAL_BASED_OUTPATIENT_CLINIC_OR_DEPARTMENT_OTHER)
Admission: EM | Admit: 2021-06-14 | Discharge: 2021-06-14 | Disposition: A | Discharge status: Home / Self Care | Payer: Medicaid Other | Attending: Emergency Medicine | Admitting: Emergency Medicine

## 2021-06-14 ENCOUNTER — Other Ambulatory Visit: Payer: Self-pay

## 2021-06-14 ENCOUNTER — Encounter (HOSPITAL_BASED_OUTPATIENT_CLINIC_OR_DEPARTMENT_OTHER): Payer: Self-pay | Admitting: Emergency Medicine

## 2021-06-14 DIAGNOSIS — R197 Diarrhea, unspecified: Secondary | ICD-10-CM | POA: Insufficient documentation

## 2021-06-14 DIAGNOSIS — Z20822 Contact with and (suspected) exposure to covid-19: Secondary | ICD-10-CM | POA: Insufficient documentation

## 2021-06-14 DIAGNOSIS — J069 Acute upper respiratory infection, unspecified: Secondary | ICD-10-CM | POA: Diagnosis present

## 2021-06-14 DIAGNOSIS — J111 Influenza due to unidentified influenza virus with other respiratory manifestations: Secondary | ICD-10-CM

## 2021-06-14 LAB — RESP PANEL BY RT-PCR (FLU A&B, COVID) ARPGX2
Influenza A by PCR: NEGATIVE
Influenza B by PCR: NEGATIVE
SARS Coronavirus 2 by RT PCR: NEGATIVE

## 2021-06-14 NOTE — ED Provider Notes (Signed)
[facility] EMERGENCY DEPARTMENT Provider Note   CSN: [id number] Arrival date & time: 06/14/21  7829     History Chief Complaint  Patient presents with   URI   Diarrhea    Chanta Bauers is a 30 y.o. female.   URI Presenting symptoms: congestion and cough   Presenting symptoms: no ear pain, no fever and no sore throat   Associated symptoms: myalgias   Associated symptoms: no arthralgias   Diarrhea Associated symptoms: myalgias and URI   Associated symptoms: no abdominal pain, no arthralgias, no chills, no fever and no vomiting    30 year old female presenting to the emergency department with an influenza-like illness.  The patient states that her child tested positive for influenza on Monday.  She has since had symptoms of cough, nasal congestion, body aches and loose stools.  She has had multiple episodes of watery loose stools today.  She is tolerating oral intake.  She denies any abdominal pain, nausea or vomiting.  She denies any chest pain.  She denies any fevers or chills.  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     Family History  Problem Relation Age of Onset   Alcohol abuse Mother    Drug abuse Mother    Early death Mother    Cerebral aneurysm Mother    Diabetes Father     Hypertension Father    Stroke Father    Asthma Sister    Diabetes Sister     Social History   Tobacco Use   Smoking status: Former    Packs/day: 0.50    Years: 3.00    Pack years: 1.50    Types: Cigarettes   Smokeless tobacco: Never   Tobacco comments:    01/2019  Substance Use Topics   Alcohol use: Yes    Comment: social - rare   Drug use: Not Currently    Types: Oxycodone    Home Medications Prior to Admission medications   Medication Sig Start Date End Date Taking? Authorizing Provider  DULoxetine (CYMBALTA) 60 MG capsule Take 1 capsule (60 mg total) by mouth daily. 01/03/21   Levert Feinstein, MD  gabapentin (NEURONTIN) 100 MG capsule Take 1 capsule (100 mg total) by mouth 3 (three) times daily. 12/29/20   Katha Cabal, DO    Allergies    Morphine, Peanut oil, and Peanut-containing drug products  Review of Systems   Review of Systems  Constitutional:  Negative for chills and fever.  HENT:  Positive for congestion. Negative for ear pain and sore throat.   Eyes:  Negative for pain and visual disturbance.  Respiratory:  Positive for cough. Negative for shortness of breath.   Cardiovascular:  Negative for chest pain and  palpitations.  Gastrointestinal:  Positive for diarrhea. Negative for abdominal pain and vomiting.  Genitourinary:  Negative for dysuria and hematuria.  Musculoskeletal:  Positive for myalgias. Negative for arthralgias and back pain.  Skin:  Negative for color change and rash.  Neurological:  Negative for seizures and syncope.  All other systems reviewed and are negative.  Physical Exam Updated Vital Signs BP 108/70   Pulse 84   Temp 98.9 F (37.2 C) (Oral)   Resp 20   Ht 5\' 2"  (1.575 m)   Wt 65.8 kg   LMP 06/08/2021   SpO2 100%   BMI 26.52 kg/m   Physical Exam Vitals and nursing note reviewed.  Constitutional:      General: She is not in acute distress.    Appearance: She is well-developed.  HENT:     Head: Normocephalic and atraumatic.   Eyes:     Conjunctiva/sclera: Conjunctivae normal.     Pupils: Pupils are equal, round, and reactive to light.  Cardiovascular:     Rate and Rhythm: Normal rate and regular rhythm.     Heart sounds: No murmur heard. Pulmonary:     Effort: Pulmonary effort is normal. No respiratory distress.     Breath sounds: Normal breath sounds.  Abdominal:     General: There is no distension.     Palpations: Abdomen is soft.     Tenderness: There is no abdominal tenderness. There is no guarding.  Musculoskeletal:        General: No deformity or signs of injury.     Cervical back: Normal range of motion and neck supple.  Skin:    General: Skin is warm and dry.     Findings: No lesion or rash.  Neurological:     General: No focal deficit present.     Mental Status: She is alert. Mental status is at baseline.    ED Results / Procedures / Treatments   Labs (all labs ordered are listed, but only abnormal results are displayed) Labs Reviewed  RESP PANEL BY RT-PCR (FLU A&B, COVID) [id number]    EKG None  Radiology No results found.  Procedures Procedures   Medications Ordered in ED Medications - No data to display  ED Course  I have reviewed the triage vital signs and the nursing notes.  Pertinent labs & imaging results that were available during my care of the patient were reviewed by me and considered in my medical decision making (see chart for details).    MDM Rules/Calculators/A&P                           30 year old female presenting to the emergency department with an influenza-like illness.  The patient states that her child tested positive for influenza on Monday.  She has since had symptoms of cough, nasal congestion, body aches and loose stools.  She has had multiple episodes of watery loose stools today.  She is tolerating oral intake.  She denies any abdominal pain, nausea or vomiting.  She denies any chest pain.  She denies any fevers or chills.  On my exam, the patient  is well-appearing and well-hydrated.  The patient's lungs are clear to auscultation bilaterally. Additionally, the patient has a soft/non-tender abdomen, clear tympanic membranes, and no oropharyngeal exudates.  There are no signs of meningismus.  I see no signs of an acute bacterial infection.  The patient's presentation is most consistent with a viral upper respiratory infection.  I have a low suspicion for pneumonia as the patient's cough has been non-productive and the patient is neither tachypneic nor hypoxic on room air.  Additionally, the patient is CTAB.  Influenza/Influenza-Like Illness is possible, especially considering the current prevalence of disease. I discussed the risks and benefits of antiviral therapy.  Influenza testing was obtained. The patient is negative for influenza.  I discussed symptomatic management, including hydration, motrin, and tylenol. The patient/family felt safe being discharged from the ED.  They agreed to followup with the PCP if needed.  I provided ED return precautions.  Final Clinical Impression(s) / ED Diagnoses Final diagnoses:  Influenza-like illness    Rx / DC Orders ED Discharge Orders     None        Ernie Avena, MD 06/14/21 [phone]

## 2021-06-14 NOTE — ED Triage Notes (Signed)
Pt has child positive for flu on Monday.  Mother is having symptoms since then, runny nose, congestion, body aches, diarrhea.  She is wanting testing and something for diarrhea.

## 2021-06-15 ENCOUNTER — Telehealth: Payer: Self-pay

## 2021-06-15 NOTE — Telephone Encounter (Signed)
Transition Care Management Follow-up Telephone Call Date of discharge and from where: 06/14/2021 from [facility] How have you been since you were released from the hospital? Pt stated that she is feeling sick  and has a headache but did not have any questions or concerns at this time.  Any questions or concerns? No  Items Reviewed: Did the pt receive and understand the discharge instructions provided? Yes  Medications obtained and verified? Yes  Other? No  Any new allergies since your discharge? No  Dietary orders reviewed? No Do you have support at home? Yes   Functional Questionnaire: (I = Independent and D = Dependent) ADLs: I Bathing/Dressing- I Meal Prep- I Eating- I Maintaining continence- I Transferring/Ambulation- I Managing Meds- I  Follow up appointments reviewed: PCP Hospital f/u appt confirmed? No   Specialist Hospital f/u appt confirmed? No   Are transportation arrangements needed? No  If their condition worsens, is the pt aware to call PCP or go to the Emergency Dept.? Yes Was the patient provided with contact information for the PCP's office or ED? Yes Was to pt encouraged to call back with questions or concerns? Yes

## 2021-09-06 ENCOUNTER — Other Ambulatory Visit: Payer: Self-pay

## 2021-09-06 ENCOUNTER — Ambulatory Visit (INDEPENDENT_AMBULATORY_CARE_PROVIDER_SITE_OTHER): Payer: Medicaid Other | Admitting: Family Medicine

## 2021-09-06 VITALS — BP 119/73 | HR 79 | Ht 62.0 in | Wt 149.8 lb

## 2021-09-06 DIAGNOSIS — G8929 Other chronic pain: Secondary | ICD-10-CM

## 2021-09-06 DIAGNOSIS — M419 Scoliosis, unspecified: Secondary | ICD-10-CM

## 2021-09-06 DIAGNOSIS — M549 Dorsalgia, unspecified: Secondary | ICD-10-CM | POA: Diagnosis not present

## 2021-09-06 DIAGNOSIS — M792 Neuralgia and neuritis, unspecified: Secondary | ICD-10-CM

## 2021-09-06 DIAGNOSIS — R202 Paresthesia of skin: Secondary | ICD-10-CM | POA: Diagnosis not present

## 2021-09-06 DIAGNOSIS — M546 Pain in thoracic spine: Secondary | ICD-10-CM | POA: Diagnosis not present

## 2021-09-06 MED ORDER — GABAPENTIN 100 MG PO CAPS: 200.0000 mg | ORAL_CAPSULE | Freq: Three times a day (TID) | ORAL | 3 refills | Status: DC
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
## 2021-09-06 NOTE — Patient Instructions (Addendum)
Stop by the pharmacy to pick up your medications.  Take 1000 mg Tylenol (2 tablets) with 200 mg Motrin (1 tablet) every 8 hours as needed for pain. Increase the Gabapentin to 200 mg three times a day for pain.  Continue taking Duloxetine daily.    Look for a phone call to schedule your physical therapy.    Watch for worsening symptoms such as an increasing weakness or loss of sensation legs, increasing pain and the loss of bladder or bowel function. Should any of these occur, go to the emergency department immediately.     Take Care,  Dr. Rachael Darby

## 2021-09-06 NOTE — Assessment & Plan Note (Addendum)
Pt is a 31 yo female with ongoing back pain. She has had imaging that showed mild thoracic and lumbar scoliosis. Reviewed neurologist (Dr Christia Reading) notes and MR Head, C-spine, T spine which other than scoliosis and mild C-spine bulging disc was unremarkable.   Red flag symptoms discussed and none identified.  Doubt cauda equina.  Discussed physical therapy and patient agreeable.  Continue OTC analgesics, Cymbalta.  Increase Gabapentin 200 mg TID.
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
## 2021-09-25 ENCOUNTER — Other Ambulatory Visit: Payer: Self-pay

## 2021-09-25 ENCOUNTER — Ambulatory Visit: Payer: Medicaid Other | Attending: Family Medicine

## 2021-09-25 DIAGNOSIS — M419 Scoliosis, unspecified: Secondary | ICD-10-CM | POA: Insufficient documentation

## 2021-09-25 DIAGNOSIS — G8929 Other chronic pain: Secondary | ICD-10-CM | POA: Diagnosis present

## 2021-09-25 DIAGNOSIS — M545 Low back pain, unspecified: Secondary | ICD-10-CM | POA: Insufficient documentation

## 2021-09-25 DIAGNOSIS — M6281 Muscle weakness (generalized): Secondary | ICD-10-CM | POA: Insufficient documentation

## 2021-09-25 DIAGNOSIS — M792 Neuralgia and neuritis, unspecified: Secondary | ICD-10-CM | POA: Diagnosis not present

## 2021-09-25 DIAGNOSIS — M549 Dorsalgia, unspecified: Secondary | ICD-10-CM | POA: Diagnosis not present

## 2021-09-25 DIAGNOSIS — R293 Abnormal posture: Secondary | ICD-10-CM | POA: Diagnosis present

## 2021-10-03 NOTE — Therapy (Signed)
OUTPATIENT PHYSICAL THERAPY TREATMENT NOTE   Patient Name: Ruth Howard MRN: [id number] DOB:12/31/1990, 31 y.o., female Today's Date: 10/04/2021  PCP: Katha Cabal, DO REFERRING PROVIDER: McDiarmid, Leighton Roach, MD   PT End of Session - 10/04/21 1101     Visit Number 2    Number of Visits 7    Date for PT Re-Evaluation 11/11/21    Authorization Type [employer] MCD    PT Start Time 1101    PT Stop Time 1141    PT Time Calculation (min) 40 min    Activity Tolerance Patient tolerated treatment well    Behavior During Therapy [facility] for tasks assessed/performed             Past Medical History:  Diagnosis Date   Anemia    Anxiety    Depression    Scoliosis    Thoracic back pain    Past Surgical History:  Procedure Laterality Date   CESAREAN SECTION     2016, 2019   Patient Active Problem List   Diagnosis Date Noted   Back pain 09/06/2021   Family history of brain aneurysm 01/03/2021   Neuropathic pain 01/03/2021   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   Paresthesia 12/15/2020   Herpes 10/26/2020   High risk heterosexual behavior 12/05/2019   Anemia 05/26/2019   Health care maintenance 05/26/2019   Anxiety and depression 05/26/2019   Family history of diabetes mellitus (DM) 05/26/2019   Family history of hypertension 05/26/2019   Headache 05/26/2019    REFERRING DIAG:  M79.2 (ICD-10-CM) - Neuropathic pain M54.9,G89.29 (ICD-10-CM) - Chronic bilateral back pain, unspecified back location M41.9 (ICD-10-CM) - Scoliosis, unspecified scoliosis type, unspecified spinal region   THERAPY DIAG:  Chronic bilateral low back pain, unspecified whether sciatica present  Abnormal posture  Muscle weakness (generalized)  PERTINENT HISTORY: Mild scoliosis per recent imaging   PRECAUTIONS: none   SUBJECTIVE: Patient reports the low back is hurting and when she pushes on it, it hurts more. She reports compliance with HEP, but does not think they are helping.   PAIN:  Are you having pain?  Yes NPRS scale: 7/10 Pain location: low back  PAIN TYPE: tight, sore  Pain description: constant  Aggravating factors: pushing on it  Relieving factors: OTC pain medication    OBJECTIVE:   *Unless otherwise noted all objective measures were captured on initial evaluation.   DIAGNOSTIC FINDINGS:  IMPRESSION: This MRI of the cervical spine with and without contrast shows the following: 1.   Spinal cord is normal. 2.   Very minimal disc bulges at C2-C3 and C5-C6 that do not lead to any spinal stenosis or nerve root compression. 3.   Normal enhancement pattern.  IMPRESSION: This is a normal MRI of the thoracic spine with and without contrast. IMPRESSION Lumbar X-ray: Mild apex LEFT lumbar scoliosis. No other significant abnormalities.   PATIENT SURVEYS:  Modified Oswestry 32% disability    MUSCLE LENGTH: Hamstrings: unable to accurately assess due to pain     POSTURE:  Excessive lumbar lordosis; anterior pelvic tilt    PALPATION: TTP bilateral lumbar paraspinals Hypermobility PAIVM L-spine    LUMBARAROM/PROM   A/PROM A/PROM  09/25/2021  Flexion Fingertips 9 inches from floor pain in Lt calf   Extension 50% limited pain in low back   Right lateral flexion WNL  Left lateral flexion WNL  Right rotation WNL; pain in low back   Left rotation WNL   (Blank rows = not tested)     LE MMT:  MMT Right 09/25/2021 Left 09/25/2021  Hip flexion 5/5 5/5  Hip extension 5/5 5/5  Hip abduction 5/5 5/5  Hip adduction      Hip internal rotation      Hip external rotation      Knee flexion 5/5 5/5  Knee extension 5/5 5/5  Ankle dorsiflexion 5/5 5/5  Ankle plantarflexion 5/5 5/5  Ankle inversion      Ankle eversion 5/5 5/5   (Blank rows = not tested)   LUMBAR SPECIAL TESTS:  SLR unable to accurately assess due to increased pain with initial movement into test position    FUNCTIONAL TESTS:  Squat: excessive trunk flexion, heel rise bilaterally    GAIT: Distance walked: 50  ft Assistive device utilized: None Level of assistance: Complete Independence Comments: WNL       TODAY'S TREATMENT  [facility] Adult PT Treatment:                                                DATE: 10/04/21 Therapeutic Exercise: Seated pelvic tilts multiple reps Supine pelvic tilts multiple reps  Supine TA brace 4 x 5; 5 sec hold  LTR 1 minute  Hooking isometric hip adduction 1 x 10; 5 sec hold            PATIENT EDUCATION:  Education details: Continue HEP  Person educated: Patient Education method: Explanation Education comprehension: verbalized understanding     HOME EXERCISE PROGRAM: Access Code: [email or web address] URL: [email or web address] Date: 09/25/2021 Prepared by: Letitia Libra   Exercises Diaphragmatic Breathing at 90/90 Supported - 2 x daily - 7 x weekly - 1 sets - 10 reps - 5 sec hold Supine Transversus Abdominis Bracing - Hands on Stomach - 2 x daily - 7 x weekly - 2 sets - 10 reps - 5 sec hold hold Supine Posterior Pelvic Tilt - 2 x daily - 7 x weekly - 2 sets - 10 reps - 5 sec hold hold     ASSESSMENT:   CLINICAL IMPRESSION: Session focused on appropriate core activation with basic core strengthening. She has extreme difficulty activating her core musculature requiring heavy verbal, tactile, and visual cues to perform abdominal bracing and pelvic tilts. Even with continued practice she is unable to complete posterior pelvic tilt independently and is only able to complete minimal reps of transverse abdominis bracing properly independently. Requires continued emphasis on core stabilization at future sessions.     REHAB POTENTIAL: Good   CLINICAL DECISION MAKING: Stable/uncomplicated   EVALUATION COMPLEXITY: Low     GOALS: Goals reviewed with patient? No   SHORT TERM GOALS:   STG Name Target Date Goal status  1 Patient will be independent and compliant with initial HEP.    Baseline:  10/09/2021 Ongoing     LONG TERM GOALS:    LTG Name  Target Date Goal status  1 Patient will demonstrate proper lifting/bending mechanics without increased pain to reduce stress on her low back and prevent future injury.  Baseline:  11/06/2021 INITIAL  2 Patient will score <20% disability on Modified Oswestry to signify clinically meaningful improvement in functional abilities.  Baseline: 11/06/2021 INITIAL  3 Patient will report pain at worst rated as 4/10 to improve her tolerance to work activities.  Baseline: 7/10 upon arrival  11/06/2021 INITIAL  4 Patient will demonstrate knowledge and application of appropriate sitting and standing posture  to reduce stress on her low back.  Baseline: 11/06/2021 INITIAL    PLAN: PT FREQUENCY: 1x/week   PT DURATION: 6 weeks   PLANNED INTERVENTIONS: Therapeutic exercises, Therapeutic activity, Neuro Muscular re-education, Balance training, Patient/Family education, Dry Needling, Electrical stimulation, Cryotherapy, Moist heat, Taping, Traction, and Manual therapy   PLAN FOR NEXT SESSION: review HEP, core stabilization, posture education    Letitia Libra, PT, DPT, ATC 10/04/21 11:42 AM

## 2021-10-04 ENCOUNTER — Ambulatory Visit: Payer: Medicaid Other

## 2021-10-04 ENCOUNTER — Other Ambulatory Visit: Payer: Self-pay

## 2021-10-04 DIAGNOSIS — R293 Abnormal posture: Secondary | ICD-10-CM

## 2021-10-04 DIAGNOSIS — M6281 Muscle weakness (generalized): Secondary | ICD-10-CM

## 2021-10-04 DIAGNOSIS — M545 Low back pain, unspecified: Secondary | ICD-10-CM

## 2021-10-04 DIAGNOSIS — G8929 Other chronic pain: Secondary | ICD-10-CM

## 2021-10-09 NOTE — Therapy (Incomplete)
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  PERTINENT HISTORY: Mild scoliosis per recent imaging   PRECAUTIONS: none   SUBJECTIVE:   PAIN:  Are you having pain? Yes NPRS scale: 7/10 Pain location: low back  PAIN TYPE: tight, sore  Pain description: constant  Aggravating factors: pushing on it  Relieving factors: OTC pain medication    OBJECTIVE:   *Unless otherwise noted all objective measures were captured on initial evaluation.   DIAGNOSTIC FINDINGS:  IMPRESSION: This MRI of the cervical spine with and without contrast shows the following: 1.   Spinal cord is normal. 2.   Very minimal disc bulges at C2-C3 and C5-C6 that do not lead to any spinal stenosis or nerve root compression. 3.   Normal enhancement pattern.  IMPRESSION: This is a normal MRI of the  thoracic spine with and without contrast. IMPRESSION Lumbar X-ray: Mild apex LEFT lumbar scoliosis. No other significant abnormalities.   PATIENT SURVEYS:  Modified Oswestry 32% disability    MUSCLE LENGTH: Hamstrings: unable to accurately assess due to pain     POSTURE:  Excessive lumbar lordosis; anterior pelvic tilt    PALPATION: TTP bilateral lumbar paraspinals Hypermobility PAIVM L-spine    LUMBARAROM/PROM   A/PROM A/PROM  09/25/2021  Flexion Fingertips 9 inches from floor pain in Lt calf   Extension 50% limited pain in low back   Right lateral flexion WNL  Left lateral flexion WNL  Right rotation WNL; pain in low back   Left rotation WNL   (Blank rows = not tested)     LE MMT:   MMT Right 09/25/2021 Left 09/25/2021  Hip flexion 5/5 5/5  Hip extension 5/5 5/5  Hip abduction 5/5 5/5  Hip adduction      Hip internal rotation      Hip external rotation      Knee flexion 5/5 5/5  Knee extension 5/5 5/5  Ankle dorsiflexion 5/5 5/5  Ankle plantarflexion 5/5 5/5  Ankle inversion      Ankle eversion 5/5 5/5   (Blank rows = not tested)   LUMBAR SPECIAL TESTS:  SLR unable to accurately assess due to increased pain with initial movement into test position    FUNCTIONAL TESTS:  Squat: excessive trunk flexion, heel rise bilaterally    GAIT: Distance walked: 50 ft Assistive device utilized: None Level  of assistance: Complete Independence Comments: WNL       TODAY'S TREATMENT  [facility] Adult PT Treatment:                                                DATE: 10/11/21 Therapeutic Exercise: *** Manual Therapy: *** Neuromuscular re-ed: *** Therapeutic Activity: *** Modalities: *** Self Care: ***   [facility] PT Treatment:                                                DATE: 10/04/21 Therapeutic Exercise: Seated pelvic tilts multiple reps Supine pelvic tilts multiple reps  Supine TA brace 4 x 5; 5 sec hold  LTR 1 minute  Hooking isometric hip adduction 1 x  10; 5 sec hold            PATIENT EDUCATION:  Education details: Continue HEP  Person educated: Patient Education method: Explanation Education comprehension: verbalized understanding     HOME EXERCISE PROGRAM: Access Code: [email or web address] URL: [email or web address] Date: 09/25/2021 Prepared by: Letitia Libra   Exercises Diaphragmatic Breathing at 90/90 Supported - 2 x daily - 7 x weekly - 1 sets - 10 reps - 5 sec hold Supine Transversus Abdominis Bracing - Hands on Stomach - 2 x daily - 7 x weekly - 2 sets - 10 reps - 5 sec hold hold Supine Posterior Pelvic Tilt - 2 x daily - 7 x weekly - 2 sets - 10 reps - 5 sec hold hold     ASSESSMENT:   CLINICAL IMPRESSION:     REHAB POTENTIAL: Good   CLINICAL DECISION MAKING: Stable/uncomplicated   EVALUATION COMPLEXITY: Low     GOALS: Goals reviewed with patient? No   SHORT TERM GOALS:   STG Name Target Date Goal status  1 Patient will be independent and compliant with initial HEP.    Baseline:  10/09/2021 Ongoing     LONG TERM GOALS:    LTG Name Target Date Goal status  1 Patient will demonstrate proper lifting/bending mechanics without increased pain to reduce stress on her low back and prevent future injury.  Baseline:  11/06/2021 INITIAL  2 Patient will score <20% disability on Modified Oswestry to signify clinically meaningful improvement in functional abilities.  Baseline: 11/06/2021 INITIAL  3 Patient will report pain at worst rated as 4/10 to improve her tolerance to work activities.  Baseline: 7/10 upon arrival  11/06/2021 INITIAL  4 Patient will demonstrate knowledge and application of appropriate sitting and standing posture to reduce stress on her low back.  Baseline: 11/06/2021 INITIAL    PLAN: PT FREQUENCY: 1x/week   PT DURATION: 6 weeks   PLANNED INTERVENTIONS: Therapeutic exercises, Therapeutic activity, Neuro Muscular re-education, Balance training, Patient/Family education, Dry Needling,  Electrical stimulation, Cryotherapy, Moist heat, Taping, Traction, and Manual therapy   PLAN FOR NEXT SESSION: review HEP, core stabilization, posture education    Letitia Libra, PT, DPT, ATC 10/09/21 9:11 AM

## 2021-10-11 ENCOUNTER — Ambulatory Visit: Payer: Medicaid Other

## 2021-10-11 ENCOUNTER — Telehealth: Payer: Self-pay

## 2021-10-11 NOTE — Telephone Encounter (Signed)
Spoke with patient regarding missed PT appointment. She forgot it was scheduled at this time. Offered to reschedule, but patient declined and stated she would be here for her next scheduled visit.   Letitia Libra, PT, DPT, ATC 10/11/21 10:02 AM

## 2021-10-17 NOTE — Therapy (Incomplete)
?OUTPATIENT PHYSICAL THERAPY TREATMENT NOTE ? ? ?Patient Name: Ruth Howard ?MRN: [id number] ?DOB:01/22/1991, 31 y.o., female ?Today's Date: 10/17/2021 ? ?PCP: Katha Cabal, DO ?REFERRING PROVIDER: McDiarmid, Leighton Roach, MD ? ? ? ? ?Past Medical History:  ?Diagnosis Date  ? Anemia   ? Anxiety   ? Depression   ? Scoliosis   ? Thoracic back pain   ? ?Past Surgical History:  ?Procedure Laterality Date  ? CESAREAN SECTION    ? 2016, 2019  ? ?Patient Active Problem List  ? Diagnosis Date Noted  ? Back pain 09/06/2021  ? Family history of brain aneurysm 01/03/2021  ? Neuropathic pain 01/03/2021  ? 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 Paresthesia 12/15/2020  ? Herpes 10/26/2020  ? High risk heterosexual behavior 12/05/2019  ? Anemia 05/26/2019  ? Health care maintenance 05/26/2019  ? Anxiety and depression 05/26/2019  ? Family history of diabetes mellitus (DM) 05/26/2019  ? Family history of hypertension 05/26/2019  ? Headache 05/26/2019  ? ? ?REFERRING DIAG:  M79.2 (ICD-10-CM) - Neuropathic pain M54.9,G89.29 (ICD-10-CM) - Chronic bilateral back pain, unspecified back location M41.9 (ICD-10-CM) - Scoliosis, unspecified scoliosis type, unspecified spinal region  ? ?THERAPY DIAG:  ?No diagnosis found. ? ?PERTINENT HISTORY: Mild scoliosis per recent imaging  ? ?PRECAUTIONS: none  ? ?SUBJECTIVE:  ? ?PAIN:  ?Are you having pain? Yes ?NPRS scale: 7/10 ?Pain location: low back  ?PAIN TYPE: tight, sore  ?Pain description: constant  ?Aggravating factors: pushing on it  ?Relieving factors: OTC pain medication  ? ? ?OBJECTIVE:  ? *Unless otherwise noted all objective measures were captured on initial evaluation.  ? ?DIAGNOSTIC FINDINGS:  ?IMPRESSION: This MRI of the cervical spine with and without contrast shows the following: ?1.   Spinal cord is normal. ?2.   Very minimal disc bulges at C2-C3 and C5-C6 that do not lead to any spinal stenosis or nerve root compression. ?3.   Normal enhancement pattern.  ?IMPRESSION: This is a normal MRI of the  thoracic spine with and without contrast. ?IMPRESSION Lumbar X-ray: ?Mild apex LEFT lumbar scoliosis. No other significant abnormalities. ?  ?PATIENT SURVEYS:  ?Modified Oswestry 32% disability  ?  ?MUSCLE LENGTH: ?Hamstrings: unable to accurately assess due to pain  ?   ?POSTURE:  ?Excessive lumbar lordosis; anterior pelvic tilt  ?  ?PALPATION: ?TTP bilateral lumbar paraspinals ?Hypermobility PAIVM L-spine  ?  ?LUMBARAROM/PROM ?  ?A/PROM A/PROM  ?09/25/2021  ?Flexion Fingertips 9 inches from floor pain in Lt calf   ?Extension 50% limited pain in low back   ?Right lateral flexion WNL  ?Left lateral flexion WNL  ?Right rotation WNL; pain in low back   ?Left rotation WNL  ? (Blank rows = not tested) ?  ?  ?LE MMT: ?  ?MMT Right ?09/25/2021 Left ?09/25/2021  ?Hip flexion 5/5 5/5  ?Hip extension 5/5 5/5  ?Hip abduction 5/5 5/5  ?Hip adduction      ?Hip internal rotation      ?Hip external rotation      ?Knee flexion 5/5 5/5  ?Knee extension 5/5 5/5  ?Ankle dorsiflexion 5/5 5/5  ?Ankle plantarflexion 5/5 5/5  ?Ankle inversion      ?Ankle eversion 5/5 5/5  ? (Blank rows = not tested) ?  ?LUMBAR SPECIAL TESTS:  ?SLR unable to accurately assess due to increased pain with initial movement into test position  ?  ?FUNCTIONAL TESTS:  ?Squat: excessive trunk flexion, heel rise bilaterally  ?  ?GAIT: ?Distance walked: 50 ft ?Assistive device utilized: None ?Level  of assistance: Complete Independence ?Comments: WNL ?  ?  ?  ?TODAY'S TREATMENT  ?[facility] Adult PT Treatment:                                                DATE: 10/18/21 ?Therapeutic Exercise: ?*** ?Manual Therapy: ?*** ?Neuromuscular re-ed: ?*** ?Therapeutic Activity: ?*** ?Modalities: ?*** ?Self Care: ?*** ? ? ?[facility] Adult PT Treatment:                                                DATE: 10/04/21 ?Therapeutic Exercise: ?Seated pelvic tilts multiple reps ?Supine pelvic tilts multiple reps  ?Supine TA brace 4 x 5; 5 sec hold  ?LTR 1 minute  ?Hooking isometric hip adduction 1 x 10;  5 sec hold  ? ? ?  ?  ?  ?  ?PATIENT EDUCATION:  ?Education details: Continue HEP  ?Person educated: Patient ?Education method: Explanation ?Education comprehension: verbalized understanding ?  ?  ?HOME EXERCISE PROGRAM: ?Access Code: [email or web address] ?URL: [email or web address] ?Date: 09/25/2021 ?Prepared by: Letitia Libra ?  ?Exercises ?Diaphragmatic Breathing at 90/90 Supported - 2 x daily - 7 x weekly - 1 sets - 10 reps - 5 sec hold ?Supine Transversus Abdominis Bracing - Hands on Stomach - 2 x daily - 7 x weekly - 2 sets - 10 reps - 5 sec hold hold ?Supine Posterior Pelvic Tilt - 2 x daily - 7 x weekly - 2 sets - 10 reps - 5 sec hold hold ?  ?  ?ASSESSMENT: ?  ?CLINICAL IMPRESSION: ? ? ?  ?REHAB POTENTIAL: Good ?  ?CLINICAL DECISION MAKING: Stable/uncomplicated ?  ?EVALUATION COMPLEXITY: Low ?  ?  ?GOALS: ?Goals reviewed with patient? No ?  ?SHORT TERM GOALS: ?  ?STG Name Target Date Goal status  ?1 Patient will be independent and compliant with initial HEP.  ?  ?Baseline:  10/09/2021 Ongoing   ?  ?LONG TERM GOALS:  ?  ?LTG Name Target Date Goal status  ?1 Patient will demonstrate proper lifting/bending mechanics without increased pain to reduce stress on her low back and prevent future injury.  ?Baseline:  11/06/2021 INITIAL  ?2 Patient will score <20% disability on Modified Oswestry to signify clinically meaningful improvement in functional abilities.  ?Baseline: 11/06/2021 INITIAL  ?3 Patient will report pain at worst rated as 4/10 to improve her tolerance to work activities.  ?Baseline: 7/10 upon arrival  11/06/2021 INITIAL  ?4 Patient will demonstrate knowledge and application of appropriate sitting and standing posture to reduce stress on her low back.  ?Baseline: 11/06/2021 INITIAL  ?  ?PLAN: ?PT FREQUENCY: 1x/week ?  ?PT DURATION: 6 weeks ?  ?PLANNED INTERVENTIONS: Therapeutic exercises, Therapeutic activity, Neuro Muscular re-education, Balance training, Patient/Family education, Dry Needling,  Electrical stimulation, Cryotherapy, Moist heat, Taping, Traction, and Manual therapy ?  ?PLAN FOR NEXT SESSION: review HEP, core stabilization, posture education  ?  ?Letitia Libra, PT, DPT, ATC ?10/17/21 10:51 AM ? ? ?  ? ?

## 2021-10-18 ENCOUNTER — Ambulatory Visit: Payer: Medicaid Other | Attending: Family Medicine

## 2021-10-18 ENCOUNTER — Telehealth: Payer: Self-pay

## 2021-10-18 DIAGNOSIS — M545 Low back pain, unspecified: Secondary | ICD-10-CM | POA: Insufficient documentation

## 2021-10-18 DIAGNOSIS — M6281 Muscle weakness (generalized): Secondary | ICD-10-CM | POA: Insufficient documentation

## 2021-10-18 DIAGNOSIS — R293 Abnormal posture: Secondary | ICD-10-CM | POA: Insufficient documentation

## 2021-10-18 DIAGNOSIS — G8929 Other chronic pain: Secondary | ICD-10-CM | POA: Insufficient documentation

## 2021-10-18 NOTE — Telephone Encounter (Signed)
LVM notifying patient of missed PT appointment. Informed patient that she can schedule one visit at a time moving forward secondary to attendance and that another missed appointment will result in discharge from PT.  ? ?Letitia Libra, PT, DPT, ATC ?10/18/21 4:13 PM ? ?

## 2021-10-22 NOTE — Progress Notes (Signed)
? ? ?  SUBJECTIVE:  ? ?CHIEF COMPLAINT / HPI:  ? ?Chills, cough, stomachache, body aches: ?She says it started Thursday. She has been feeling chills, cough, tactile fevers, sore throat, body aches. No chest pains or shortness of breath. Has had nausea. Her daughter was recently ill. No vomiting or diarrhea.  ? ?PERTINENT  PMH / PSH: None relevant ? ?OBJECTIVE:  ? ?BP 100/87   Pulse 84   Temp 98.7 ?F (37.1 ?C)   Ht 5\' 2"  (1.575 m)   Wt 151 lb 9.6 oz (68.8 kg)   LMP 10/02/2021   SpO2 95%   BMI 27.73 kg/m?   ? ?General: NAD, pleasant, able to participate in exam ?HEENT: No pharyngeal erythema, no pharyngeal erythema, no cervical lymphadenopathy ?Cardiac: RRR, no murmurs. ?Respiratory: CTAB, normal effort, No wheezes, rales or rhonchi ?Abdomen: Bowel sounds present, nontender ?Skin: warm and dry, no rashes noted ? ?ASSESSMENT/PLAN:  ? ?Cough, chills, myalgias: ?Assessment: 31 y.o. female with symptoms suggestive of viral URI.  Physical exam has clear lungs, normal vitals.  She has been experiencing myalgias, congestion, nausea, stomachaches, chills and subjective fevers.  Low suspicion for pneumonia given lungs clear to auscultation.  Her symptoms are most suggestive of influenza versus COVID-19 infection.  We will test for both today.  Point-of-care flu testing returned negative.  Discussed strict return precautions.  She is outside the window for treatment for influenza and she is low risk for COVID-19. ? ?37, DO ?[facility]  ? ?

## 2021-10-23 ENCOUNTER — Other Ambulatory Visit: Payer: Self-pay

## 2021-10-23 ENCOUNTER — Ambulatory Visit (INDEPENDENT_AMBULATORY_CARE_PROVIDER_SITE_OTHER): Payer: Medicaid Other | Admitting: Family Medicine

## 2021-10-23 VITALS — BP 100/87 | HR 84 | Temp 98.7°F | Ht 62.0 in | Wt 151.6 lb

## 2021-10-23 DIAGNOSIS — Z20822 Contact with and (suspected) exposure to covid-19: Secondary | ICD-10-CM

## 2021-10-23 DIAGNOSIS — J069 Acute upper respiratory infection, unspecified: Secondary | ICD-10-CM | POA: Diagnosis not present

## 2021-10-23 DIAGNOSIS — R109 Unspecified abdominal pain: Secondary | ICD-10-CM

## 2021-10-23 LAB — POCT INFLUENZA A/B
Influenza A, POC: NEGATIVE
Influenza B, POC: NEGATIVE

## 2021-10-23 NOTE — Patient Instructions (Signed)
You were seen today and tested for flu and for COVID-19.  You should see the results when they are available in MyChart.  If you experience any trouble breathing, chest pains, vomiting to the extent you cannot keep down fluids, confusion, or other concerning symptoms please be evaluated in the ER or in a same-day visit.  Continue drinking plenty of fluids.  You can use Tylenol and ibuprofen for your discomfort.  I anticipate that you will feel better in the next 1 to 2 days.  You can return to work when you are not vomiting and have no fevers and your COVID-19 test returns normal. ?

## 2021-10-24 LAB — NOVEL CORONAVIRUS, NAA: SARS-CoV-2, NAA: NOT DETECTED

## 2021-10-25 ENCOUNTER — Ambulatory Visit: Payer: Medicaid Other

## 2021-11-01 ENCOUNTER — Other Ambulatory Visit: Payer: Self-pay

## 2021-11-01 ENCOUNTER — Ambulatory Visit: Payer: Medicaid Other

## 2021-11-01 DIAGNOSIS — M545 Low back pain, unspecified: Secondary | ICD-10-CM | POA: Diagnosis present

## 2021-11-01 DIAGNOSIS — G8929 Other chronic pain: Secondary | ICD-10-CM | POA: Diagnosis present

## 2021-11-01 DIAGNOSIS — M6281 Muscle weakness (generalized): Secondary | ICD-10-CM

## 2021-11-01 DIAGNOSIS — R293 Abnormal posture: Secondary | ICD-10-CM | POA: Diagnosis present

## 2021-11-01 NOTE — Therapy (Addendum)
?OUTPATIENT PHYSICAL THERAPY TREATMENT NOTE ? ? ?Patient Name: Ruth Howard ?MRN: [id number] ?DOB:[birth year], 31 y.o., female ?Today's Date: 11/01/2021 ? ?PCP: Lyndee Hensen, DO ?REFERRING PROVIDER: McDiarmid, Blane Ohara, MD ? ? PT End of Session - 11/01/21 1145   ? ? Visit Number 3   ? Number of Visits 7   ? Date for PT Re-Evaluation 11/11/21   ? Authorization Type [employer] MCD   ? PT Start Time 1146   ? PT Stop Time 5809   ? PT Time Calculation (min) 43 min   ? Activity Tolerance Patient limited by pain   ? Behavior During Therapy [facility] for tasks assessed/performed   ? ?  ?  ? ?  ? ? ? ?Past Medical History:  ?Diagnosis Date  ? Anemia   ? Anxiety   ? Depression   ? Scoliosis   ? Thoracic back pain   ? ?Past Surgical History:  ?Procedure Laterality Date  ? CESAREAN SECTION    ? 2016, 2019  ? ?Patient Active Problem List  ? Diagnosis Date Noted  ? Back pain 09/06/2021  ? Family history of brain aneurysm 01/03/2021  ? Neuropathic pain 01/03/2021  ? 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 Paresthesia 12/15/2020  ? Herpes 10/26/2020  ? High risk heterosexual behavior 12/05/2019  ? Anemia 05/26/2019  ? Health care maintenance 05/26/2019  ? Anxiety and depression 05/26/2019  ? Family history of diabetes mellitus (DM) 05/26/2019  ? Family history of hypertension 05/26/2019  ? Headache 05/26/2019  ? ? ?REFERRING DIAG:  M79.2 (ICD-10-CM) - Neuropathic pain M54.9,G89.29 (ICD-10-CM) - Chronic bilateral back pain, unspecified back location M41.9 (ICD-10-CM) - Scoliosis, unspecified scoliosis type, unspecified spinal region  ? ?THERAPY DIAG:  ?Chronic bilateral low back pain, unspecified whether sciatica present ? ?Abnormal posture ? ?Muscle weakness (generalized) ? ?PERTINENT HISTORY: Mild scoliosis per recent imaging  ? ?PRECAUTIONS: none  ? ?SUBJECTIVE:  ?Patient reports she has been sick, which is why she missed appointments. She reports the back pain is still the same. She reports completing her HEP occasionally since last session.  ?PAIN:  ?Are  you having pain? Yes ?NPRS scale: 5/10 ?Pain location: low back  ?PAIN TYPE: dull, sharp   ?Pain description: constant  ?Aggravating factors: bending  ?Relieving factors: rest  ? ? ?OBJECTIVE:  ? *Unless otherwise noted all objective measures were captured on initial evaluation.  ? ?DIAGNOSTIC FINDINGS:  ?IMPRESSION: This MRI of the cervical spine with and without contrast shows the following: ?1.   Spinal cord is normal. ?2.   Very minimal disc bulges at C2-C3 and C5-C6 that do not lead to any spinal stenosis or nerve root compression. ?3.   Normal enhancement pattern.  ?IMPRESSION: This is a normal MRI of the thoracic spine with and without contrast. ?IMPRESSION Lumbar X-ray: ?Mild apex LEFT lumbar scoliosis. No other significant abnormalities. ?  ?PATIENT SURVEYS:  ?Modified Oswestry 32% disability  ?  ?MUSCLE LENGTH: ?Hamstrings: unable to accurately assess due to pain  ?   ?POSTURE:  ?Excessive lumbar lordosis; anterior pelvic tilt  ?  ?PALPATION: ?TTP bilateral lumbar paraspinals ?Hypermobility PAIVM L-spine  ?  ?LUMBARAROM/PROM ?  ?A/PROM A/PROM  ?09/25/2021 11/01/21  ?Flexion Fingertips 9 inches from floor pain in Lt calf  Fingertips 14 inches from floor; LBP   ?Extension 50% limited pain in low back  50% limited LBP   ?Right lateral flexion WNL WNL  ?Left lateral flexion WNL WNL  ?Right rotation WNL; pain in low back  WNL  ?Left rotation WNL  WNL  ? (Blank rows = not tested) ?  ?  ?LE MMT: ?  ?MMT Right ?09/25/2021 Left ?09/25/2021  ?Hip flexion 5/5 5/5  ?Hip extension 5/5 5/5  ?Hip abduction 5/5 5/5  ?Hip adduction      ?Hip internal rotation      ?Hip external rotation      ?Knee flexion 5/5 5/5  ?Knee extension 5/5 5/5  ?Ankle dorsiflexion 5/5 5/5  ?Ankle plantarflexion 5/5 5/5  ?Ankle inversion      ?Ankle eversion 5/5 5/5  ? (Blank rows = not tested) ?  ?LUMBAR SPECIAL TESTS:  ?SLR unable to accurately assess due to increased pain with initial movement into test position  ?  ?FUNCTIONAL TESTS:  ?Squat:  excessive trunk flexion, heel rise bilaterally  ?  ?GAIT: ?Distance walked: 50 ft ?Assistive device utilized: None ?Level of assistance: Complete Independence ?Comments: WNL ?  ?  ?  ?TODAY'S TREATMENT  ?[facility] Adult PT Treatment:                                                DATE: 11/01/21 ?Therapeutic Exercise: ?Supine pelvic tilts 2 x 10 ?LTR with figure 4; 1 minute each  ?Single knee to chest x 60 sec each  ?Bent knee fall out with TA brace 2 x 10 bilateral  ?Hip adduction isometric 2 x 10; 5 sec hold  ?Resisted hip abduction in hooklying red band 2 x 10  ?Supine TA march 2 x 10  ?Clamshell 2 x 10 bilateral  ? ?[facility] Adult PT Treatment:                                                DATE: 10/04/21 ?Therapeutic Exercise: ?Seated pelvic tilts multiple reps ?Supine pelvic tilts multiple reps  ?Supine TA brace 4 x 5; 5 sec hold  ?LTR 1 minute  ?Hooking isometric hip adduction 1 x 10; 5 sec hold  ? ? ?  ?  ?  ?  ?PATIENT EDUCATION:  ?Education details: Continue HEP; PNE  ?Person educated: Patient ?Education method: Explanation ?Education comprehension: verbalized understanding ?  ?  ?HOME EXERCISE PROGRAM: ?Access Code: [email or web address] ?URL: [email or web address] ?Date: 09/25/2021 ?Prepared by: Gwendolyn Grant ?  ?Exercises ?Diaphragmatic Breathing at 90/90 Supported - 2 x daily - 7 x weekly - 1 sets - 10 reps - 5 sec hold ?Supine Transversus Abdominis Bracing - Hands on Stomach - 2 x daily - 7 x weekly - 2 sets - 10 reps - 5 sec hold hold ?Supine Posterior Pelvic Tilt - 2 x daily - 7 x weekly - 2 sets - 10 reps - 5 sec hold hold ?  ?  ?ASSESSMENT: ?  ?CLINICAL IMPRESSION: ?Patient returns to PT after lapse in care secondary to illness. She demonstrates regression in lumbar flexion AROM and no change in lumbar extension AROM compared to initial evaluation reporting increased pain with both movements and fear of movement, especially with flexion. Began pain neuroscience education, though patient will likely require  reinforcement at future sessions. Fair tolerance to gentle lumbar mobility and hip/core strengthening today as patient reports pain with all prescribed ther ex. She was encouraged to continue with HEP that was initially prescribed with patient verbalizing understanding.  ? ?  ?  REHAB POTENTIAL: Good ?  ?CLINICAL DECISION MAKING: Stable/uncomplicated ?  ?EVALUATION COMPLEXITY: Low ?  ?  ?GOALS: ?Goals reviewed with patient? No ?  ?SHORT TERM GOALS: ?  ?STG Name Target Date Goal status  ?1 Patient will be independent and compliant with initial HEP.  ?  ?Baseline:  10/09/2021 Ongoing   ?  ?LONG TERM GOALS:  ?  ?LTG Name Target Date Goal status  ?1 Patient will demonstrate proper lifting/bending mechanics without increased pain to reduce stress on her low back and prevent future injury.  ?Baseline:  11/06/2021 INITIAL  ?2 Patient will score <20% disability on Modified Oswestry to signify clinically meaningful improvement in functional abilities.  ?Baseline: 11/06/2021 INITIAL  ?3 Patient will report pain at worst rated as 4/10 to improve her tolerance to work activities.  ?Baseline: 7/10 upon arrival  11/06/2021 INITIAL  ?4 Patient will demonstrate knowledge and application of appropriate sitting and standing posture to reduce stress on her low back.  ?Baseline: 11/06/2021 INITIAL  ?  ?PLAN: ?PT FREQUENCY: 1x/week ?  ?PT DURATION: 6 weeks ?  ?PLANNED INTERVENTIONS: Therapeutic exercises, Therapeutic activity, Neuro Muscular re-education, Balance training, Patient/Family education, Dry Needling, Electrical stimulation, Cryotherapy, Moist heat, Taping, Traction, and Manual therapy ?  ?PLAN FOR NEXT SESSION: re-eval; review HEP, core stabilization, posture education  ?  ?Gwendolyn Grant, PT, DPT, ATC ?11/01/21 12:30 PM ? ?PHYSICAL THERAPY DISCHARGE SUMMARY ? ?Visits from Start of Care: 3 ? ?Current functional level related to goals / functional outcomes: ?Unknown see above  ?  ?Remaining deficits: ?Unknown  ?  ?Education /  Equipment: ?HEP, core  ? ?Patient agrees to discharge. Patient goals were partially met. Patient is being discharged due to not returning since the last visit.  ?  ?Raeford Razor, PT ?12/25/21 3:47 PM ?Phone: [phone]

## 2021-11-08 ENCOUNTER — Ambulatory Visit: Payer: Medicaid Other

## 2022-01-23 ENCOUNTER — Encounter: Payer: Self-pay | Admitting: *Deleted

## 2022-02-26 ENCOUNTER — Ambulatory Visit (INDEPENDENT_AMBULATORY_CARE_PROVIDER_SITE_OTHER): Payer: Medicaid Other | Admitting: Family Medicine

## 2022-02-26 ENCOUNTER — Encounter: Payer: Self-pay | Admitting: Family Medicine

## 2022-02-26 ENCOUNTER — Ambulatory Visit: Payer: Medicaid Other

## 2022-02-26 ENCOUNTER — Ambulatory Visit
Admission: RE | Admit: 2022-02-26 | Discharge: 2022-02-26 | Disposition: A | Discharge status: Home / Self Care | Payer: Medicaid Other | Source: Ambulatory Visit | Attending: Family Medicine | Admitting: Family Medicine

## 2022-02-26 ENCOUNTER — Telehealth: Payer: Self-pay

## 2022-02-26 VITALS — BP 112/81 | HR 82 | Ht 62.0 in

## 2022-02-26 DIAGNOSIS — M25571 Pain in right ankle and joints of right foot: Secondary | ICD-10-CM | POA: Diagnosis not present

## 2022-02-26 MED ORDER — NAPROXEN 500 MG PO TABS: 500.0000 mg | ORAL_TABLET | Freq: Two times a day (BID) | ORAL | 0 refills | Status: DC

## 2022-02-26 NOTE — Telephone Encounter (Signed)
Patient calls nurse line requesting results from xray.   Patient is still in a lot of pain and reports needing to tell her employer if she will be out by the end of the day.   Patient reports finically she would like to return to work asap.   Will forward to provider who saw patient.

## 2022-02-26 NOTE — Telephone Encounter (Signed)
Called patient. Reviewed results. Recommend following up with Orthopedics---pain over medial malleolus not explained by x-ray findings which appear to be chronic. Work note written in [place] for patient.

## 2022-02-26 NOTE — Progress Notes (Signed)
    SUBJECTIVE:   CHIEF COMPLAINT: right foot pain HPI:   Ruth Howard is a 31 y.o.  with history notable for mood disorder presenting for right foot pain.  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 Normal S1/S2. No murmurs, rubs, or gallops appreciated. Lungs: Clear bilaterally to ascultation.  Psych: Pleasant and appropriate   MSK   Inspection: + edema over medial aspect of right foot and ankle Palpation: TTP over medial malleolus and all along posterior tibialis  ROM: Pain with inversion and dorsiflexion but good ROOM Strength: reduced dorsiflexion strength due to pain, 4/5, 5/5 inversion, eversion and plantar flexion  Sensation: intact Special Testing: negative   Thompson test    ASSESSMENT/PLAN:   Right ankle pain concern for stress fracture, also strongly suspect posterior tibialis tendinopathy, less likely infection given absence of fevers, duration, no injury, no skin breakdown. Less likely gout or plantar fascitis. Given history of neuropathic pain, posterior tibial nerve injury also possible.  - X-ray foot and ankle - Work note for today, placed in CAM walker pending x-rays  -  Naproxen for pain - Reviewed return precautions - Referral to Orthopedics       02/02/2022, MD  Family Medicine Teaching Service  [facility]  ;

## 2022-02-26 NOTE — Patient Instructions (Signed)
It was wonderful to see you today.  Please bring ALL of your medications with you to every visit.   Today we talked about:  For pain - Ice and heat twice a day - You can take naproxen twice a day with food for pain  An x-ray was ordered for you---you do not need an appointment to have this completed.  I recommend going to [facility] [address] [place] [address] [place]   If the results are normal,I will send you a letter  I will call you with results if anything is abnormal   I have referred you to Orthopedics  to further evaluate your concern. If you do not received a phone call about this appointment within 2 weeks, please call our office back at [phone]. Jazmin Hartsell coordinates our referrals and can assist you in this.     Please follow up in 1-2  months   Thank you for choosing [facility].   Please call [phone] with any questions about today's appointment.  Please be sure to schedule follow up at the front  desk before you leave today.   Terisa Starr, MD  Family Medicine

## 2022-02-27 ENCOUNTER — Ambulatory Visit (INDEPENDENT_AMBULATORY_CARE_PROVIDER_SITE_OTHER): Payer: Medicaid Other | Admitting: Surgery

## 2022-02-27 DIAGNOSIS — M25571 Pain in right ankle and joints of right foot: Secondary | ICD-10-CM | POA: Diagnosis not present

## 2022-02-27 DIAGNOSIS — M79671 Pain in right foot: Secondary | ICD-10-CM

## 2022-02-27 DIAGNOSIS — M76821 Posterior tibial tendinitis, right leg: Secondary | ICD-10-CM | POA: Diagnosis not present

## 2022-02-27 DIAGNOSIS — M722 Plantar fascial fibromatosis: Secondary | ICD-10-CM | POA: Diagnosis not present

## 2022-02-27 NOTE — Progress Notes (Signed)
Office Visit Note   Patient: Ruth Howard           Date of Birth: June 05, 1991           MRN: [id number] Visit Date: 02/27/2022              Requested by: Martyn Malay, MD [address] [place],  [place] [address] PCP: Gerrit Heck, MD   Assessment & Plan: Visit Diagnoses:  1. Acute right ankle pain   2. Acute foot pain, right   3. Posterior tibial tendinitis, right leg   4. Plantar fasciitis, right     Plan: Advised patient to go back into the boot that she was previously given.  Given a note taken her out x1 week.  Follow-up me in 1 week for recheck.  Today I did offer patient conservative management with plantar fascia Marcaine/Depo-Medrol injection for Toradol 30 mg and Depo-Medrol 80 mg IM injection but patient declined all of them.  I did show her some stretching exercises that she can do at home.  Use ice off-and-on as needed.  Can continue naproxen.  Blood work was drawn today to check a CBC and arthritis panel.  Follow-Up Instructions: Return in about 1 week (around 03/06/2022).   Orders:  Orders Placed This Encounter  Procedures   CBC   Antinuclear Antib (ANA)   Sed Rate (ESR)   Rheumatoid Factor   Uric acid   No orders of the defined types were placed in this encounter.     Procedures: No procedures performed   Clinical Data: No additional findings.   Subjective: Chief Complaint  Patient presents with   Right Ankle - Pain    HPI 31 year old black female is new patient to clinic comes in today with complaints of sudden onset right ankle and foot pain.  States that pain started about 2 to 3 weeks ago.  No injury.  No previous problems for onset.  Patient denies known personal history of inflammatory arthropathy.  But states that she has family members on her mother side that had lupus.  She was seen by her primary care physician yesterday.  Ordered x-rays and patient was put in a boot.  Patient did not come in with a boot today.  She has been also  taking naproxen.  Localizes most of her pain to the plantar calcaneal, medial foot and also across the ankle joint. Review of Systems No current cardiopulmonary GI/GU issues  Objective: Vital Signs: LMP 01/31/2022 (Exact Date)   Physical Exam HENT:     Head: Normocephalic.     Nose: Nose normal.  Pulmonary:     Effort: No respiratory distress.  Musculoskeletal:     Comments: Gait is antalgic.  Right ankle limited range of motion due to pain.  No swelling or bruising.  She is markedly tender across the anterior tibiotalar joint line, plantar fascia calcaneal insertion, posterior tibial tendon.  Neurological:     Mental Status: She is alert.     Ortho Exam  Specialty Comments:  No specialty comments available.  Imaging: No results found.   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   Paresthesia 12/15/2020   Herpes 10/26/2020   High risk heterosexual behavior 12/05/2019   Anemia 05/26/2019   Health care maintenance 05/26/2019   Anxiety and depression 05/26/2019   Family history  of diabetes mellitus (DM) 05/26/2019   Family history of hypertension 05/26/2019   Headache 05/26/2019   Past Medical History:  Diagnosis Date   Anemia    Anxiety    Depression    Scoliosis    Thoracic back pain     Family History  Problem Relation Age of Onset   Alcohol abuse Mother    Drug abuse Mother    Early death Mother    Cerebral aneurysm Mother    Diabetes Father    Hypertension Father    Stroke Father    Asthma Sister    Diabetes Sister     Past Surgical History:  Procedure Laterality Date   CESAREAN SECTION     2016, 2019   Social History   Occupational History   Occupation: home health aid    Employer: [employer]  Tobacco Use   Smoking status: Former    Packs/day: 0.50    Years: 3.00    Total  pack years: 1.50    Types: Cigarettes   Smokeless tobacco: Never   Tobacco comments:    01/2019  Substance and Sexual Activity   Alcohol use: Yes    Comment: social - rare   Drug use: Not Currently    Types: Oxycodone   Sexual activity: Yes    Birth control/protection: None

## 2022-02-28 LAB — SEDIMENTATION RATE: Sed Rate: 11 mm/h (ref 0–20)

## 2022-02-28 LAB — CBC WITH DIFFERENTIAL/PLATELET
Absolute Monocytes: 602 cells/uL (ref 200–950)
Basophils Absolute: 19 cells/uL (ref 0–200)
Basophils Relative: 0.3 %
Eosinophils Absolute: 83 cells/uL (ref 15–500)
Eosinophils Relative: 1.3 %
HCT: 37.1 % (ref 35.0–45.0)
Hemoglobin: 11.6 g/dL — ABNORMAL LOW (ref 11.7–15.5)
Lymphs Abs: 2150 cells/uL (ref 850–3900)
MCH: 24.9 pg — ABNORMAL LOW (ref 27.0–33.0)
MCHC: 31.3 g/dL — ABNORMAL LOW (ref 32.0–36.0)
MCV: 79.6 fL — ABNORMAL LOW (ref 80.0–100.0)
MPV: 11 fL (ref 7.5–12.5)
Monocytes Relative: 9.4 %
Neutro Abs: 3546 cells/uL (ref 1500–7800)
Neutrophils Relative %: 55.4 %
Platelets: 260 10*3/uL (ref 140–400)
RBC: 4.66 10*6/uL (ref 3.80–5.10)
RDW: 14 % (ref 11.0–15.0)
Total Lymphocyte: 33.6 %
WBC: 6.4 10*3/uL (ref 3.8–10.8)

## 2022-02-28 LAB — URIC ACID: Uric Acid, Serum: 4.8 mg/dL (ref 2.5–7.0)

## 2022-02-28 LAB — ANA: Anti Nuclear Antibody (ANA): NEGATIVE

## 2022-02-28 LAB — RHEUMATOID FACTOR: Rheumatoid fact SerPl-aCnc: <14 IU/mL (ref <14)

## 2022-03-21 ENCOUNTER — Other Ambulatory Visit (HOSPITAL_COMMUNITY)
Admission: RE | Admit: 2022-03-21 | Discharge: 2022-03-21 | Disposition: A | Discharge status: Home / Self Care | Payer: Medicaid Other | Source: Ambulatory Visit | Attending: Family Medicine | Admitting: Family Medicine

## 2022-03-21 ENCOUNTER — Ambulatory Visit (INDEPENDENT_AMBULATORY_CARE_PROVIDER_SITE_OTHER): Payer: Medicaid Other | Admitting: Student

## 2022-03-21 ENCOUNTER — Encounter: Payer: Self-pay | Admitting: Student

## 2022-03-21 VITALS — BP 119/79 | HR 88 | Ht 62.0 in | Wt 150.8 lb

## 2022-03-21 DIAGNOSIS — Z23 Encounter for immunization: Secondary | ICD-10-CM | POA: Diagnosis not present

## 2022-03-21 DIAGNOSIS — B009 Herpesviral infection, unspecified: Secondary | ICD-10-CM | POA: Diagnosis not present

## 2022-03-21 MED ORDER — VALACYCLOVIR HCL 500 MG PO TABS: 500.0000 mg | ORAL_TABLET | Freq: Two times a day (BID) | ORAL | 0 refills | Status: AC

## 2022-03-21 NOTE — Patient Instructions (Signed)
It was great to see you! Thank you for allowing me to participate in your care!   I recommend that you always bring your medications to each appointment as this makes it easy to ensure we are on the correct medications and helps [place] not miss when refills are needed.  Our plans for today:  - take antiviral 2 times daily for 3 days - will check titers/get vaccines  We are checking some labs today, I will call you if they are abnormal will send you a MyChart message or a letter if they are normal.  If you do not hear about your labs in the next 2 weeks please let us know.  Take care and seek immediate care sooner if you develop any concerns. Please remember to show up 15 minutes before your scheduled appointment time!  Levin Erp, MD [facility]

## 2022-03-21 NOTE — Progress Notes (Signed)
    SUBJECTIVE:   CHIEF COMPLAINT / HPI:   Labial rash: Patient is a 31 y.o. female presenting with labial rash for 2 weeks.  She states she does not have any vaginal discharge and is not interested in wet prep today.  She is interested in screening for sexually transmitted infections today.  She has had a history of oral herpes before and says that she has had a history of a partner with genital herpes.  She says that the blisters look similar to what her oral herpes looked like and that it burns and itches.  She says that there was clear discharge whenever 1 broke open.  She says that she does shave her pubic region.  Needs immunizations for college: Patient does not have access to her vaccination records that she is from [place] and she says that she "got her vaccinations in a van".  She is unsure what she has had but she did bring in a picture of what college would like.  Tdap booster, Polio, MMR -series, Hepatitis B, Varicella.  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 Unable to obtain swab as no vesicles to unroof. Risk benefit discussed-will opt to treat with valacyclovir. F/u if not improving. - Cervicovaginal ancillary only - RPR - HIV antibody (with reflex) - valACYclovir (VALTREX) 500 MG tablet; Take 1 tablet (500 mg total) by mouth 2 (two) times daily for 3 days.  Dispense: 6 tablet; Refill:  0  Immunization due Vaccinations unable to given in clinic until immune titers obtained. Patient from [place] - Measles/Mumps/Rubella Immunity - HBsAb Quant HBIG Assessment - Varicella Zoster Abs, IgG/IgM   Gerrit Heck, MD [place]

## 2022-03-21 NOTE — Assessment & Plan Note (Addendum)
Possibly had HSV on labia. Unable to obtain swab as no vesicles to unroof. Risk benefit discussed-will opt to treat with valacyclovir. F/u if not improving. - Cervicovaginal ancillary only - RPR - HIV antibody (with reflex) - valACYclovir (VALTREX) 500 MG tablet; Take 1 tablet (500 mg total) by mouth 2 (two) times daily for 3 days.  Dispense: 6 tablet; Refill: 0

## 2022-03-22 LAB — MEASLES/MUMPS/RUBELLA IMMUNITY
MUMPS ABS, IGG: 238 AU/mL (ref >10.9)
RUBEOLA AB, IGG: >300 AU/mL (ref >16.4)
Rubella Antibodies, IGG: 18.8 index (ref >0.99)

## 2022-03-22 LAB — RPR: RPR Ser Ql: NONREACTIVE

## 2022-03-22 LAB — CERVICOVAGINAL ANCILLARY ONLY
Chlamydia: NEGATIVE
Comment: NEGATIVE
Comment: NEGATIVE
Comment: NORMAL
Neisseria Gonorrhea: NEGATIVE
Trichomonas: NEGATIVE

## 2022-03-22 LAB — VARICELLA ZOSTER ABS, IGG/IGM
Varicella IgM: <0.91 index (ref 0.00–0.90)
Varicella zoster IgG: 2848 index (ref >165)

## 2022-03-22 LAB — HBSAB QUANT HBIG ASSESSMENT: HBsAb Quant HBIG Assessment: 4.4 m[IU]/mL

## 2022-03-22 LAB — HIV ANTIBODY (ROUTINE TESTING W REFLEX): HIV Screen 4th Generation wRfx: NONREACTIVE

## 2022-03-29 ENCOUNTER — Ambulatory Visit (INDEPENDENT_AMBULATORY_CARE_PROVIDER_SITE_OTHER): Payer: Medicaid Other | Admitting: Family Medicine

## 2022-03-29 VITALS — BP 107/77 | HR 93 | Ht 62.0 in | Wt 150.4 lb

## 2022-03-29 DIAGNOSIS — H60502 Unspecified acute noninfective otitis externa, left ear: Secondary | ICD-10-CM | POA: Diagnosis not present

## 2022-03-29 MED ORDER — CIPROFLOXACIN-DEXAMETHASONE 0.3-0.1 % OT SUSP: 4.0000 [drp] | Freq: Two times a day (BID) | OTIC | 0 refills | Status: AC

## 2022-03-29 NOTE — Patient Instructions (Signed)
It was great to see you!  You have an infection of your left ear. I have sent drops to your pharmacy. Use 4 drops twice daily for 7 days.  If your pain does not improve, please schedule a follow up appointment to look in your ears again.  You can continue using Tylenol for pain as well.  Do not put anything (bobby pins, q-tips, or other) in your ears!  Take care, Dr Anner Crete

## 2022-03-29 NOTE — Progress Notes (Signed)
    SUBJECTIVE:   CHIEF COMPLAINT / HPI:   Left Ear Pain -used bobby-pin to clean out her ears 5 days ago -pain started the following day -both ears hurt, but left significantly worse than right -left ear also feels clogged -some sore throat, otherwise no symptoms (no fever, cough, rhinorrhea, etc) -no obvious ear drainage -has been taking Tylenol which helps slightly  PERTINENT  PMH / PSH: none  OBJECTIVE:   BP 107/77   Pulse 93   Ht 5\' 2"  (1.575 m)   Wt 150 lb 6.4 oz (68.2 kg)   LMP 03/07/2022 (Exact Date)   SpO2 100%   BMI 27.51 kg/m   Gen: NAD, pleasant, able to participate in exam HEENT: [facility]/AT, PERRLA, nares patent bilaterally, oropharynx unremarkable, right external ear and TM wnl, left ear with mild swelling of external canal and associated purulent drainage, TM appears intact although is not well visualized secondary to drainage Neck: supple, no cervical or supraclavicular lymphadenopathy CV: RRR, normal S1/S2, no murmur Resp: Normal effort, lungs CTAB Extremities: no edema or cyanosis Skin: warm and dry, no rashes noted Neuro: alert, no obvious focal deficits Psych: Normal affect and mood   ASSESSMENT/PLAN:   Left Otitis Externa Presentation consistent with otitis externa of left ear. TM appears intact. Will treat with course of ciprodex drops. Counseled not to put anything (q-tips, bobby pins, or otherwise) in the ears. Return if no improvement.   03/09/2022, MD [facility]

## 2022-07-10 ENCOUNTER — Ambulatory Visit: Payer: Medicaid Other

## 2022-07-10 NOTE — Progress Notes (Deleted)
    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   Paresthesia 12/15/2020   Herpes 10/26/2020   High risk heterosexual behavior 12/05/2019   Anemia 05/26/2019   Health care maintenance 05/26/2019   Anxiety and depression 05/26/2019   Family history of diabetes mellitus (DM) 05/26/2019   Family history of hypertension 05/26/2019   Headache 05/26/2019    Current Outpatient Medications  Medication Instructions   naproxen (NAPROSYN) 500 mg, Oral, 2 times daily with meals       03/29/2022    3:02 PM 03/21/2022    9:38 AM 02/26/2022    8:57 AM  Vitals with BMI  Height 5\' 2"  5\' 2"  5\' 2"   Weight 150 lbs 6 oz 150 lbs 13 oz   BMI 27.5 27.57   Systolic 107 119  Diastolic 77 79 81  Pulse 93 88 82      OBJECTIVE:   There were no vitals taken for this visit.  ***  ASSESSMENT/PLAN:   There are no diagnoses linked to this encounter.   There are no Patient Instructions on file for this visit.   , MD [facility]

## 2022-07-11 ENCOUNTER — Ambulatory Visit: Payer: Medicaid Other | Admitting: Family Medicine

## 2022-08-22 ENCOUNTER — Ambulatory Visit (INDEPENDENT_AMBULATORY_CARE_PROVIDER_SITE_OTHER): Payer: Medicaid Other | Admitting: Student

## 2022-08-22 ENCOUNTER — Telehealth: Payer: Self-pay | Admitting: Student

## 2022-08-22 VITALS — BP 118/80 | HR 86 | Ht 62.0 in | Wt 155.0 lb

## 2022-08-22 DIAGNOSIS — Z3490 Encounter for supervision of normal pregnancy, unspecified, unspecified trimester: Secondary | ICD-10-CM | POA: Insufficient documentation

## 2022-08-22 DIAGNOSIS — Z3A01 Less than 8 weeks gestation of pregnancy: Secondary | ICD-10-CM | POA: Diagnosis not present

## 2022-08-22 DIAGNOSIS — R42 Dizziness and giddiness: Secondary | ICD-10-CM | POA: Diagnosis not present

## 2022-08-22 DIAGNOSIS — R110 Nausea: Secondary | ICD-10-CM | POA: Diagnosis not present

## 2022-08-22 DIAGNOSIS — Z3491 Encounter for supervision of normal pregnancy, unspecified, first trimester: Secondary | ICD-10-CM | POA: Diagnosis not present

## 2022-08-22 DIAGNOSIS — Z3201 Encounter for pregnancy test, result positive: Secondary | ICD-10-CM | POA: Diagnosis present

## 2022-08-22 LAB — POCT URINE PREGNANCY: Preg Test, Ur: POSITIVE — AB

## 2022-08-22 MED ORDER — DICLEGIS 10-10 MG PO TBEC: 1.0000 | DELAYED_RELEASE_TABLET | Freq: Every evening | ORAL | 0 refills | Status: DC

## 2022-08-22 NOTE — Assessment & Plan Note (Addendum)
Urine pregnancy positive today in clinic.  Based on LMP, she is 5 weeks and 6 days gestation. Provided with resources for clinics that perform elective surgical and medical abortions.  Discussed cost and timelines regarding state laws. Reassured her that we are supportive of either decision she makes and will provide prenatal care or supportive care should she decide to terminate. Obtained initial prenatal labs today in the event that she decides to continue with the pregnancy.  Encouraged her to continue prenatal vitamin until she makes a decision. Prescribed diclegus for nausea. Encourage plenty of fluid hydration and eating small meals throughout the day to help alleviate her dizziness. If her dizziness persists, she is to let us know and come back for visit.

## 2022-08-22 NOTE — Progress Notes (Signed)
    SUBJECTIVE:   CHIEF COMPLAINT / HPI:   Ruth Howard is a 32 year-old female here after having a positive home pregnancy test.  This was not a planned pregnancy. LMP 07/12/2022.  She is unsure if she wants to continue with this pregnancy.  She says she already has her hands quite full with her 4 other children.  She does not have much support with family or friends.  She is a single mom.  She says that after her partner found out that she was pregnant, he left.  She is here to discuss options for termination versus continuing the pregnancy.  She has been feeling extremely dizzy and lightheaded and having terrible nausea.  She says that she did not feel this poorly in her prior pregnancies. Denies any sick symptoms.  PERTINENT  PMH / PSH: Reviewed  OBJECTIVE:   BP 118/80   Pulse 86   Ht 5\' 2"  (1.575 m)   Wt 155 lb (70.3 kg)   LMP 07/12/2022   SpO2 99%   BMI 28.35 kg/m  General: Well-appearing female, no distress CV: Regular rate and rhythm Respiratory: Normal work of breathing on room air Extremities: Warm and well-perfused Psych: Pleasant.  Intermittently tearful.  ASSESSMENT/PLAN:   Pregnancy Urine pregnancy positive today in clinic.  Based on LMP, she is 5 weeks and 6 days gestation. Provided with resources for clinics that perform elective surgical and medical abortions.  Discussed cost and timelines regarding state laws. Reassured her that we are supportive of either decision she makes and will provide prenatal care or supportive care should she decide to terminate. Obtained initial prenatal labs today in the event that she decides to continue with the pregnancy.  Encouraged her to continue prenatal vitamin until she makes a decision. Prescribed diclegus for nausea. Encourage plenty of fluid hydration and eating small meals throughout the day to help alleviate her dizziness. If her dizziness persists, she is to let us know and come back for visit.     Orvis Brill,  [place]

## 2022-08-22 NOTE — Telephone Encounter (Signed)
See phone note

## 2022-08-24 LAB — CBC/D/PLT+RPR+RH+ABO+RUBIGG...
Antibody Screen: NEGATIVE
Basophils Absolute: 0 10*3/uL (ref 0.0–0.2)
Basos: 1 %
Bilirubin, UA: NEGATIVE
EOS (ABSOLUTE): 0 10*3/uL (ref 0.0–0.4)
Eos: 1 %
Glucose, UA: NEGATIVE
HCV Ab: NONREACTIVE
HIV Screen 4th Generation wRfx: NONREACTIVE
Hematocrit: 38.6 % (ref 34.0–46.6)
Hemoglobin: 12 g/dL (ref 11.1–15.9)
Hepatitis B Surface Ag: NEGATIVE
Immature Grans (Abs): 0 10*3/uL (ref 0.0–0.1)
Immature Granulocytes: 0 %
Ketones, UA: NEGATIVE
Leukocytes,UA: NEGATIVE
Lymphocytes Absolute: 2.1 10*3/uL (ref 0.7–3.1)
Lymphs: 32 %
MCH: 24.4 pg — ABNORMAL LOW (ref 26.6–33.0)
MCHC: 31.1 g/dL — ABNORMAL LOW (ref 31.5–35.7)
MCV: 79 fL (ref 79–97)
Monocytes Absolute: 0.5 10*3/uL (ref 0.1–0.9)
Monocytes: 8 %
Neutrophils Absolute: 3.9 10*3/uL (ref 1.4–7.0)
Neutrophils: 58 %
Nitrite, UA: NEGATIVE
Platelets: 293 10*3/uL (ref 150–450)
Protein,UA: NEGATIVE
RBC, UA: NEGATIVE
RBC: 4.92 x10E6/uL (ref 3.77–5.28)
RDW: 14.4 % (ref 11.7–15.4)
RPR Ser Ql: NONREACTIVE
Rh Factor: POSITIVE
Rubella Antibodies, IGG: 17.8 index (ref >0.99)
Specific Gravity, UA: 1.021 (ref 1.005–1.030)
Urobilinogen, Ur: 0.2 mg/dL (ref 0.2–1.0)
WBC: 6.6 10*3/uL (ref 3.4–10.8)
pH, UA: 7 (ref 5.0–7.5)

## 2022-08-24 LAB — HGB FRACTIONATION CASCADE
Hgb A2: 2.1 % (ref 1.8–3.2)
Hgb A: 97.9 % (ref 96.4–98.8)
Hgb F: 0 % (ref 0.0–2.0)
Hgb S: 0 %

## 2022-08-24 LAB — MICROSCOPIC EXAMINATION
Casts: NONE SEEN /lpf
RBC, Urine: NONE SEEN /hpf (ref 0–2)

## 2022-08-24 LAB — URINE CULTURE, OB REFLEX

## 2022-08-24 LAB — HCV INTERPRETATION

## 2023-01-28 ENCOUNTER — Emergency Department (HOSPITAL_BASED_OUTPATIENT_CLINIC_OR_DEPARTMENT_OTHER)
Admission: EM | Admit: 2023-01-28 | Discharge: 2023-01-28 | Disposition: A | Discharge status: Home / Self Care | Payer: Medicaid Other | Attending: Emergency Medicine | Admitting: Emergency Medicine

## 2023-01-28 ENCOUNTER — Other Ambulatory Visit: Payer: Self-pay

## 2023-01-28 ENCOUNTER — Emergency Department (HOSPITAL_BASED_OUTPATIENT_CLINIC_OR_DEPARTMENT_OTHER): Payer: Medicaid Other

## 2023-01-28 ENCOUNTER — Encounter (HOSPITAL_BASED_OUTPATIENT_CLINIC_OR_DEPARTMENT_OTHER): Payer: Self-pay | Admitting: Emergency Medicine

## 2023-01-28 DIAGNOSIS — Y92410 Unspecified street and highway as the place of occurrence of the external cause: Secondary | ICD-10-CM | POA: Diagnosis not present

## 2023-01-28 DIAGNOSIS — M549 Dorsalgia, unspecified: Secondary | ICD-10-CM | POA: Diagnosis present

## 2023-01-28 DIAGNOSIS — Z91010 Allergy to peanuts: Secondary | ICD-10-CM | POA: Diagnosis not present

## 2023-01-28 DIAGNOSIS — M5442 Lumbago with sciatica, left side: Secondary | ICD-10-CM | POA: Diagnosis not present

## 2023-01-28 DIAGNOSIS — M545 Low back pain, unspecified: Secondary | ICD-10-CM | POA: Diagnosis not present

## 2023-01-28 LAB — PREGNANCY, URINE: Preg Test, Ur: NEGATIVE

## 2023-01-28 MED ORDER — IBUPROFEN 800 MG PO TABS
800.0000 mg | ORAL_TABLET | Freq: Once | ORAL | Status: AC
Start: 2023-01-28 — End: 2023-01-28
  Administered 2023-01-28: 800 mg via ORAL
  Filled 2023-01-28: qty 1

## 2023-01-28 MED ORDER — LIDOCAINE 5 % EX PTCH
2.0000 | MEDICATED_PATCH | CUTANEOUS | Status: DC
  Administered 2023-01-28: 2 via TRANSDERMAL
  Filled 2023-01-28: qty 2

## 2023-01-28 MED ORDER — CYCLOBENZAPRINE HCL 10 MG PO TABS: 10.0000 mg | ORAL_TABLET | Freq: Two times a day (BID) | ORAL | 0 refills | Status: DC | PRN

## 2023-01-28 MED ORDER — IBUPROFEN 600 MG PO TABS: 600.0000 mg | ORAL_TABLET | Freq: Four times a day (QID) | ORAL | 0 refills | Status: DC | PRN
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
## 2023-01-28 NOTE — ED Notes (Signed)
Discharge instructions reviewed with patient. Patient questions answered and opportunity for education reviewed. Patient voices understanding of discharge instructions with no further questions. Patient ambulatory with steady gait to lobby.  
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
## 2023-01-28 NOTE — Discharge Instructions (Addendum)
Please take tylenol/ibuprofen every 6 hours and flexeril for pain. I recommend close follow-up with PCP for reevaluation.  Please do not hesitate to return to emergency department if worrisome signs symptoms we discussed become apparent.

## 2023-01-31 ENCOUNTER — Ambulatory Visit (INDEPENDENT_AMBULATORY_CARE_PROVIDER_SITE_OTHER): Payer: Medicaid Other | Admitting: Student

## 2023-01-31 VITALS — BP 111/70 | HR 89 | Ht 62.0 in | Wt 157.6 lb

## 2023-01-31 DIAGNOSIS — M545 Low back pain, unspecified: Secondary | ICD-10-CM | POA: Diagnosis not present

## 2023-01-31 DIAGNOSIS — S060X0A Concussion without loss of consciousness, initial encounter: Secondary | ICD-10-CM

## 2023-01-31 MED ORDER — BACLOFEN 10 MG PO TABS: 10.0000 mg | ORAL_TABLET | Freq: Three times a day (TID) | ORAL | 0 refills | Status: AC

## 2023-01-31 NOTE — Patient Instructions (Addendum)
It was wonderful to meet you today. Thank you for allowing me to be a part of your care. Below is a short summary of what we discussed at your visit today:  I suspect the back pain that you are having are most likely due to contusions.  Given that you have having some increased sleepiness with Flexeril I have switched your muscle relaxant to baclofen.  I recommend you continue doing Advil and you can interchange diet with Tylenol for pain.  I have sent a referral for physical therapy.  Also you can get over-the-counter Voltaren gel which you should apply on the affected areas.  For your headache we have decided to hold off on getting any head CT scan at this time but will consider it if headache gets worse or you start having other visual changes  Follow up in 2 weeks  If you have any questions or concerns, please do not hesitate to contact us via phone or MyChart message.   Jerre Simon, MD [facility]
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
## 2023-02-07 ENCOUNTER — Encounter: Payer: Self-pay | Admitting: Family Medicine

## 2023-02-07 ENCOUNTER — Ambulatory Visit (INDEPENDENT_AMBULATORY_CARE_PROVIDER_SITE_OTHER): Payer: Medicaid Other | Admitting: Family Medicine

## 2023-02-07 VITALS — BP 110/60 | HR 101 | Ht 62.0 in | Wt 154.5 lb

## 2023-02-07 DIAGNOSIS — M546 Pain in thoracic spine: Secondary | ICD-10-CM

## 2023-02-07 DIAGNOSIS — M542 Cervicalgia: Secondary | ICD-10-CM

## 2023-02-07 MED ORDER — GABAPENTIN 100 MG PO CAPS: 100.0000 mg | ORAL_CAPSULE | Freq: Three times a day (TID) | ORAL | 3 refills | Status: DC
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
## 2023-02-07 NOTE — Patient Instructions (Signed)
We are going to get x-rays of your neck and upper back.  I am sending him prescription for gabapentin, we are going to start off with a low-dose of 100 mg (I would start taking at night to see if it makes you too tired or not, you can increase this to 3 times a day.  If after a day or 2 you feel like it is not doing much you can increase the dose to 300 mg.

## 2023-02-20 ENCOUNTER — Ambulatory Visit: Payer: Medicaid Other | Attending: Family Medicine

## 2023-02-20 ENCOUNTER — Other Ambulatory Visit: Payer: Self-pay

## 2023-02-20 DIAGNOSIS — M546 Pain in thoracic spine: Secondary | ICD-10-CM

## 2023-02-20 DIAGNOSIS — M545 Low back pain, unspecified: Secondary | ICD-10-CM | POA: Insufficient documentation

## 2023-02-20 DIAGNOSIS — M5459 Other low back pain: Secondary | ICD-10-CM | POA: Diagnosis present
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
## 2023-03-04 ENCOUNTER — Emergency Department (HOSPITAL_BASED_OUTPATIENT_CLINIC_OR_DEPARTMENT_OTHER)
Admission: EM | Admit: 2023-03-04 | Discharge: 2023-03-04 | Disposition: A | Discharge status: Home / Self Care | Payer: Medicaid Other | Source: Home / Self Care | Attending: Emergency Medicine | Admitting: Emergency Medicine

## 2023-03-04 ENCOUNTER — Encounter (HOSPITAL_BASED_OUTPATIENT_CLINIC_OR_DEPARTMENT_OTHER): Payer: Self-pay

## 2023-03-04 ENCOUNTER — Other Ambulatory Visit: Payer: Self-pay

## 2023-03-04 DIAGNOSIS — H1031 Unspecified acute conjunctivitis, right eye: Secondary | ICD-10-CM

## 2023-03-04 DIAGNOSIS — Z91010 Allergy to peanuts: Secondary | ICD-10-CM | POA: Insufficient documentation

## 2023-03-04 DIAGNOSIS — H5789 Other specified disorders of eye and adnexa: Secondary | ICD-10-CM | POA: Diagnosis present

## 2023-03-04 MED ORDER — ERYTHROMYCIN 5 MG/GM OP OINT: TOPICAL_OINTMENT | OPHTHALMIC | 0 refills | Status: AC

## 2023-03-04 NOTE — ED Provider Notes (Signed)
[place] [facility] Provider Note   CSN: [id number] Arrival date & time: 03/04/23  1022     History  Chief Complaint  Patient presents with   Eye Drainage    Ruth Howard is a 32 y.o. female with overall noncontributory past medical history presents with concern for right eye itching, drainage last night.  Patient reports that she was exposed to pinkeye so was concerned about developing pinkeye herself.  She denies any vision loss.  She denies any fever, chills.  She does not wear contacts, she denies any injury to the eye itself.  HPI     Home Medications Prior to Admission medications   Medication Sig Start Date End Date Taking? Authorizing Provider  erythromycin ophthalmic ointment Place a 1/2 inch ribbon of ointment into the lower eyelid four times dayily for 7 days 03/04/23  Yes Laurrie Toppin H, PA-C  baclofen (LIORESAL) 10 MG tablet Take 1 tablet (10 mg total) by mouth 3 (three) times daily. 01/31/23   Jerre Simon, MD  Doxylamine-Pyridoxine (DICLEGIS) 10-10 MG TBEC Take 1 tablet by mouth at bedtime. Patient not taking: Reported on 02/20/2023 08/22/22   Darral Dash, DO  gabapentin (NEURONTIN) 100 MG capsule Take 1 capsule (100 mg total) by mouth 3 (three) times daily. Patient not taking: Reported on 02/20/2023 02/07/23   Lilland, Alana, DO  ibuprofen (ADVIL) 600 MG tablet Take 1 tablet (600 mg total) by mouth every 6 (six) hours as needed. 01/28/23   Jeanelle Malling, PA  naproxen (NAPROSYN) 500 MG tablet Take 1 tablet (500 mg total) by mouth 2 (two) times daily with a meal. Patient not taking: Reported on 02/20/2023 02/26/22   Westley Chandler, MD      Allergies    Morphine, Peanut oil, and Peanut-containing drug products    Review of Systems   Review of Systems  All other systems reviewed and are negative.   Physical Exam Updated Vital Signs BP 115/74 (BP Location: Right Arm)   Pulse 94   Temp 98.9 F (37.2 C) (Oral)   Resp 18   Ht  5\' 2"  (1.575 m)   Wt 70.8 kg   LMP 02/12/2023 (Approximate)   SpO2 100%   BMI 28.53 kg/m  Physical Exam Vitals and nursing note reviewed.  Constitutional:      General: She is not in acute distress.    Appearance: Normal appearance.  HENT:     Head: Normocephalic and atraumatic.  Eyes:     General:        Right eye: No discharge.        Left eye: No discharge.     Comments: No significant discharge or redness of conjunctivae bilaterally.  No drainage.  EOMs are intact, no proptosis.  Cardiovascular:     Rate and Rhythm: Normal rate and regular rhythm.  Pulmonary:     Effort: Pulmonary effort is normal. No respiratory distress.  Musculoskeletal:        General: No deformity.  Skin:    General: Skin is warm and dry.  Neurological:     Mental Status: She is alert and oriented to person, place, and time.  Psychiatric:        Mood and Affect: Mood normal.        Behavior: Behavior normal.     ED Results / Procedures / Treatments   Labs (all labs ordered are listed, but only abnormal results are displayed) Labs Reviewed - No data to display  EKG None  Radiology No results found.  Procedures Procedures    Medications Ordered in ED Medications - No data to display  ED Course/ Medical Decision Making/ A&P                             Medical Decision Making  This patient is a 32 y.o. female who presents to the ED for concern of eye irritation, drainage.   Differential diagnoses prior to evaluation: Conjunctivitis, conjunctival irritation, allergies, overall with low concern for preseptal or orbital cellulitis  Past Medical History / Social History / Additional history: Chart reviewed. Pertinent results include: overall noncontributory  Physical Exam: Physical exam performed. The pertinent findings include: No significant discharge or redness of conjunctivae bilaterally.  No drainage.  EOMs are intact, no proptosis.  Medications / Treatment: Discussed with  patient I do not believe that she has an acute bacterial conjuncitivitis on exam at this time, however, considering her recent exposure, think it is reasonable to treat with romycin if symptoms worsen or persist   Disposition: After consideration of the diagnostic results and the patients response to treatment, I feel that patient with possible early conjunctivitis, discussed treating with romycin as needed. Patient understands and agrees to plan .   emergency department workup does not suggest an emergent condition requiring admission or immediate intervention beyond what has been performed at this time. The plan is: as above. The patient is safe for discharge and has been instructed to return immediately for worsening symptoms, change in symptoms or any other concerns.  Final Clinical Impression(s) / ED Diagnoses Final diagnoses:  Acute conjunctivitis of right eye, unspecified acute conjunctivitis type    Rx / DC Orders ED Discharge Orders          Ordered    erythromycin ophthalmic ointment        03/04/23 1051              Liliya Fullenwider, [place] H, PA-C 03/04/23 1106    Melene Plan, DO 03/04/23 1129

## 2023-03-04 NOTE — Discharge Instructions (Addendum)
As we discussed I am suspicious that you have an allergic or viral conjunctivitis of the eye stated bacterial based on the appearance today, these conditions are likely to resolve on their own.  However if you continue to have itching, drainage, redness for several days use the antibiotics I prescribed to treat for bacterial conjunctivitis.  Please place the solution on the bottom eyelid 4 times daily for 1

## 2023-03-04 NOTE — ED Triage Notes (Signed)
C/o right eye itching, drainage since last night.

## 2023-03-07 ENCOUNTER — Ambulatory Visit: Payer: Medicaid Other

## 2023-03-14 ENCOUNTER — Ambulatory Visit (INDEPENDENT_AMBULATORY_CARE_PROVIDER_SITE_OTHER): Payer: Medicaid Other | Admitting: Family Medicine

## 2023-03-14 ENCOUNTER — Other Ambulatory Visit (HOSPITAL_COMMUNITY)
Admission: RE | Admit: 2023-03-14 | Discharge: 2023-03-14 | Disposition: A | Discharge status: Home / Self Care | Payer: Medicaid Other | Source: Ambulatory Visit | Attending: Family Medicine | Admitting: Family Medicine

## 2023-03-14 ENCOUNTER — Ambulatory Visit: Payer: Medicaid Other | Admitting: Physical Therapy

## 2023-03-14 ENCOUNTER — Ambulatory Visit: Payer: Medicaid Other

## 2023-03-14 VITALS — BP 106/72 | HR 91 | Ht 62.0 in | Wt 156.6 lb

## 2023-03-14 DIAGNOSIS — N926 Irregular menstruation, unspecified: Secondary | ICD-10-CM | POA: Diagnosis not present

## 2023-03-14 DIAGNOSIS — R399 Unspecified symptoms and signs involving the genitourinary system: Secondary | ICD-10-CM

## 2023-03-14 DIAGNOSIS — N898 Other specified noninflammatory disorders of vagina: Secondary | ICD-10-CM | POA: Insufficient documentation

## 2023-03-14 LAB — POCT URINALYSIS DIP (CLINITEK)
Bilirubin, UA: NEGATIVE
Glucose, UA: NEGATIVE mg/dL
Ketones, POC UA: NEGATIVE mg/dL
Leukocytes, UA: NEGATIVE
Nitrite, UA: NEGATIVE
POC PROTEIN,UA: NEGATIVE
Spec Grav, UA: 1.015 (ref 1.010–1.025)
Urobilinogen, UA: 0.2 E.U./dL
pH, UA: 7 (ref 5.0–8.0)

## 2023-03-14 LAB — POCT UA - MICROSCOPIC ONLY: WBC, Ur, HPF, POC: NONE SEEN (ref 0–5)

## 2023-03-14 LAB — POCT URINE PREGNANCY: Preg Test, Ur: NEGATIVE

## 2023-03-14 MED ORDER — FLUCONAZOLE 150 MG PO TABS: 150.0000 mg | ORAL_TABLET | Freq: Every day | ORAL | 0 refills | Status: AC

## 2023-03-14 NOTE — Progress Notes (Signed)
    SUBJECTIVE:   CHIEF COMPLAINT / HPI:   Urinary Tract Infection  This is a new (Started having urinary odor about 1 week ago with dysuria associated with back and pelvic pain) problem. The problem occurs intermittently. The problem has been gradually improving (She has been drinking a lot of water which helps). Quality: Her pelvic feels like she need to move her bowel and her lower back aches about 7/10 in severity. There has been no fever. She is Sexually active (Last sexual activity was 1 week ago). Associated symptoms include a discharge, nausea and a possible pregnancy. Pertinent negatives include no hematuria, urgency or vomiting. Associated symptoms comments: She spotted blood 2 days after sex. Since then no vaginal bleed. LMP 02/12/23, usually regular period. She has tried NSAIDs and acetaminophen for the symptoms. The treatment provided significant relief. There is no history of recurrent UTIs.    PERTINENT  PMH / PSH: PMHx reviewed   OBJECTIVE:   BP 106/72   Pulse 91   Ht 5\' 2"  (1.575 m)   Wt 156 lb 9.6 oz (71 kg)   LMP 02/12/2023 (Approximate)   SpO2 99%   BMI 28.64 kg/m   Physical Exam Vitals and nursing note reviewed. Exam conducted with a chaperone present Dahlia Client [place]).  Constitutional:      Appearance: Normal appearance.  Cardiovascular:     Rate and Rhythm: Normal rate and regular rhythm.     Heart sounds: Normal heart sounds.  Pulmonary:     Effort: Pulmonary effort is normal. No respiratory distress.     Breath sounds: Normal breath sounds. No wheezing.  Abdominal:     General: Abdomen is flat. Bowel sounds are normal. There is no distension.     Palpations: Abdomen is soft. There is no mass.     Tenderness: There is no abdominal tenderness.  Genitourinary:    Exam position: Lithotomy position.     Labia:        Right: No tenderness or lesion.        Left: No tenderness or lesion.      Cervix: No cervical motion tenderness.     Comments: Scanty whitish  discharge on the vaginal vault. Dark brown blood mix with mucus on the the cervix and vaginal vault - starting her period I believe) Musculoskeletal:     Lumbar back: Normal.      ASSESSMENT/PLAN:   Dysuria: UA neg for Nitrite and Leukocytes, + trace blood (starting her period) Less likely UTI Symptoms may be related to yeast infection Keep well hydrated for now  Concern for pregnancy: Upreg negative Starting her period today Birth control discussed She does not want to be on anything for contraception for now as she does not have sex often I advised her to f/u with PCP soon to discuss further  Vaginal discharge: She requested cervical STD screening HIV and RPR testing offered, but she declined She might have yeast infection based on GU exam I discussed diflucan now given her symptoms vs waiting on test results She opted for diflucan now Med escribed I will contact her with results soon   Janit Pagan, MD [facility]

## 2023-03-14 NOTE — Patient Instructions (Signed)
Vaginitis  Vaginitis is irritation and swelling of the vagina. Treatment will depend on the cause. What are the causes? It can be caused by: Bacteria. Yeast. A parasite. A virus. Low hormone levels. Bubble baths, scented tampons, and feminine sprays. Other things can change the balance of the yeast and bacteria that live in the vagina. These include: Antibiotic medicines. Not being clean enough. Some birth control methods. Sex. Infection. Diabetes. A weakened body defense system (immune system). What increases the risk? Smoking or being around someone who smokes. Using washes (douches), scented tampons, or scented pads. Wearing tight pants or thong underwear. Using birth control pills or an IUD. Having sex without a condom or having a lot of partners. Having an STI. Using a certain product to kill sperm (nonoxynol-9). Eating foods that are high in sugar. Having diabetes. Having low levels of a female hormone. Having a weakened body defense system. Being pregnant or breastfeeding. What are the signs or symptoms? Fluid coming from the vagina that is not normal. A bad smell. Itching, pain, or swelling. Pain with sex. Pain or burning when you pee (urinate). Sometimes there are no symptoms. How is this treated? Treatment may include: Antibiotic creams or pills. Antifungal medicines. Medicines to ease symptoms if you have a virus. Your sex partner should also be treated. Estrogen medicines. Avoiding scented soaps, sprays, or douches. Stopping use of products that caused irritation and then using a cream to treat symptoms. Follow these instructions at home: Lifestyle Keep the area around your vagina clean and dry. Avoid using soap. Rinse the area with water. Until your doctor says it is okay: Do not use washes for the vagina. Do not use tampons. Do not have sex. Wipe from front to back after going to the bathroom. When your doctor says it is okay, practice safe sex  and use condoms. General instructions Take over-the-counter and prescription medicines only as told by your doctor. If you were prescribed an antibiotic medicine, take or use it as told by your doctor. Do not stop taking or using it even if you start to feel better. Keep all follow-up visits. How is this prevented? Do not use things that can irritate the vagina, such as fabric softeners. Avoid these products if they are scented: Sprays. Detergents. Tampons. Products for cleaning the vagina. Soaps or bubble baths. Let air reach your vagina. To do this: Wear cotton underwear. Do not wear: Underwear while you sleep. Tight pants. Thong underwear. Underwear or nylons without a cotton panel. Take off any wet clothing, such as bathing suits, as soon as you can. Practice safe sex and use condoms. Contact a doctor if: You have pain in your belly or in the area between your hips. You have a fever or chills. Your symptoms last for more than 2-3 days. Get help right away if: You have a fever and your symptoms get worse all of a sudden. Summary Vaginitis is irritation and swelling of the vagina. Treatment will depend on the cause of the condition. Do not use washes or tampons or have sex until your doctor says it is okay. This information is not intended to replace advice given to you by your health care provider. Make sure you discuss any questions you have with your health care provider. Document Revised: 02/04/2020 Document Reviewed: 02/04/2020 Elsevier Patient Education  2024 [employer].

## 2023-03-15 NOTE — Progress Notes (Signed)
Hello team,  Please contact patient and advise - Your STD screening as well as bacterial vaginosis and yeast test were negative. Please follow up soon if not feeling better.  Thanks.

## 2023-03-15 NOTE — Progress Notes (Signed)
Called patient to informed her about her STD screen, Patient understood. I told her to follow up if she needs to.

## 2023-03-18 ENCOUNTER — Telehealth: Payer: Self-pay

## 2023-03-18 NOTE — Telephone Encounter (Signed)
Patient called stating she has a lot going on right now and has cancelled all future appt would like to schedule out farther dates. Advised POC ends 8/14 would need to see if that can be extended prior to scheduling any appt.

## 2023-03-19 ENCOUNTER — Ambulatory Visit: Payer: Medicaid Other

## 2023-03-21 ENCOUNTER — Ambulatory Visit: Payer: Medicaid Other

## 2023-03-26 ENCOUNTER — Ambulatory Visit: Payer: Medicaid Other

## 2023-03-28 ENCOUNTER — Ambulatory Visit: Payer: Medicaid Other | Admitting: Physical Therapy

## 2023-04-16 IMAGING — CR DG CERVICAL SPINE COMPLETE 4+V
4 series · 4 of 4 positions shown · non-contrast
Comparison: None.

CLINICAL DATA: 29-year-old female with neck pain.

EXAM:
CERVICAL SPINE - COMPLETE 4+ VIEW

[w c-spine lat]
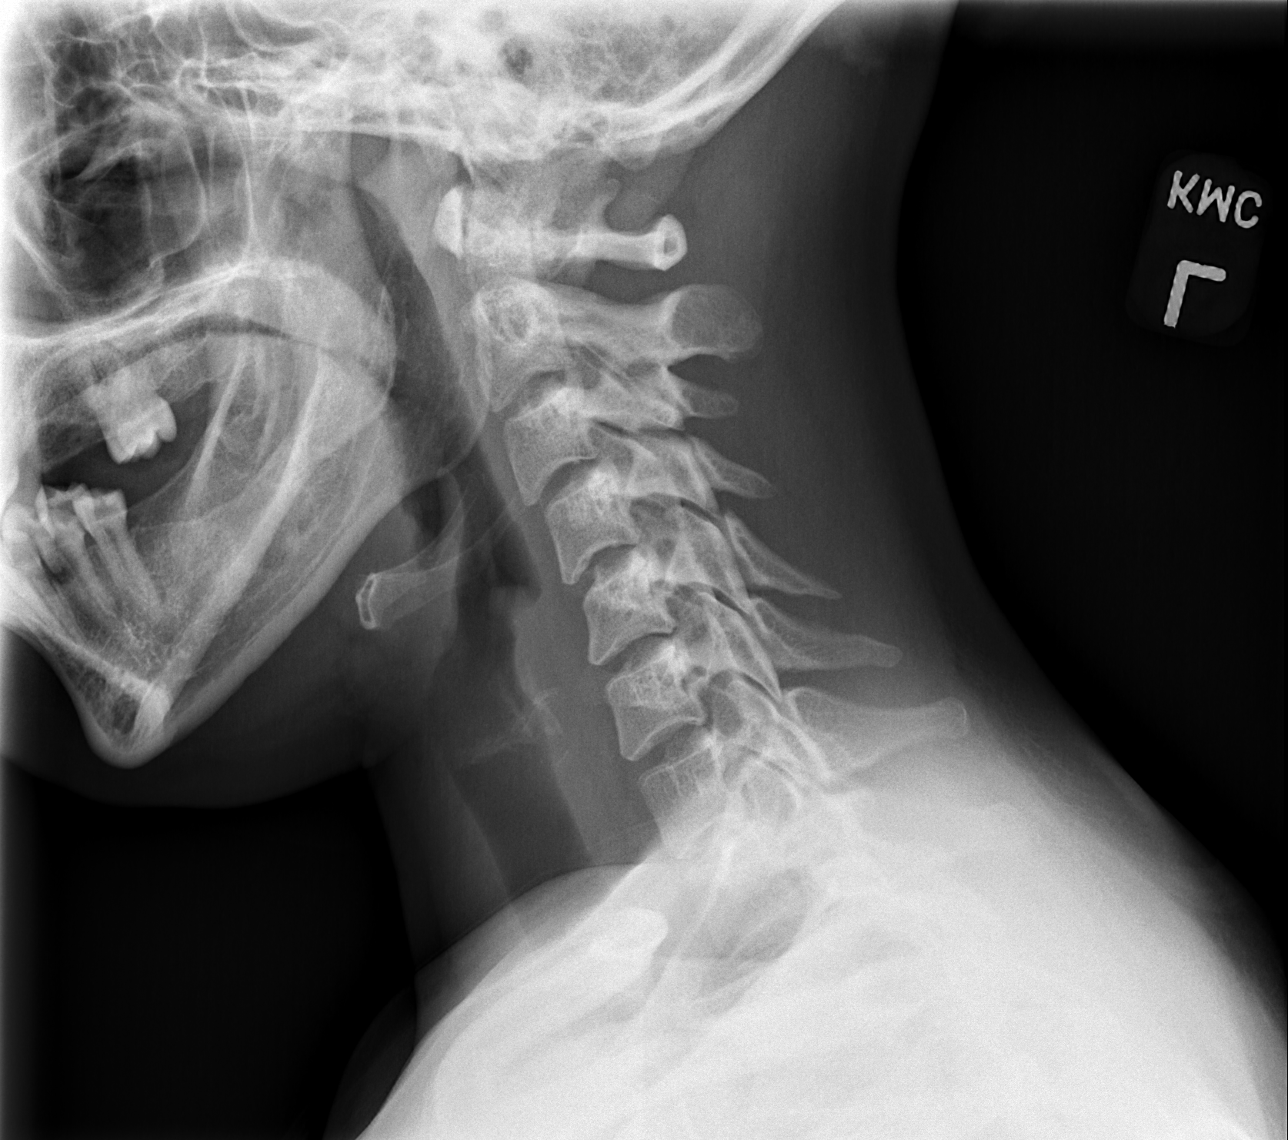

[w c-spine oblique (1 of 2)]
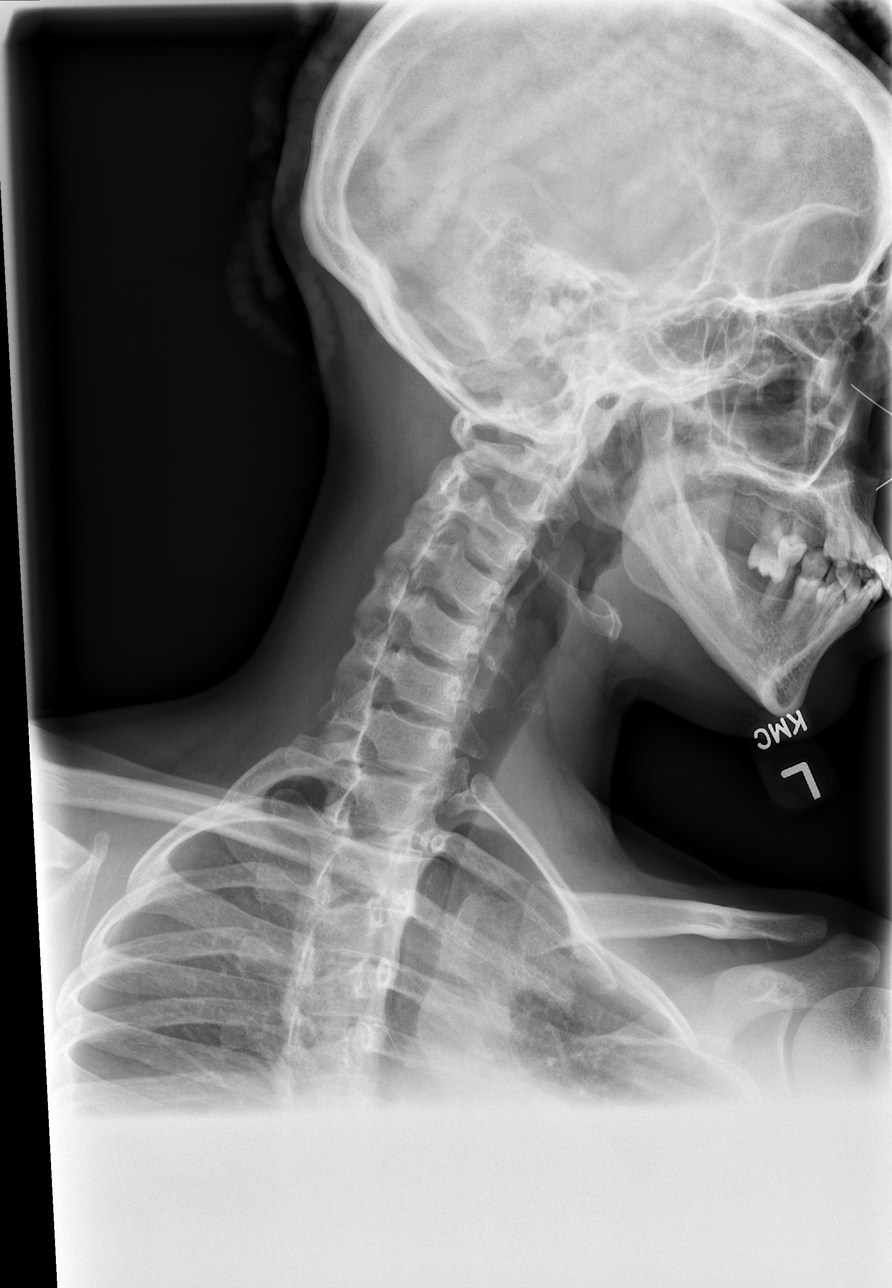

[w c-spine oblique (2 of 2)]
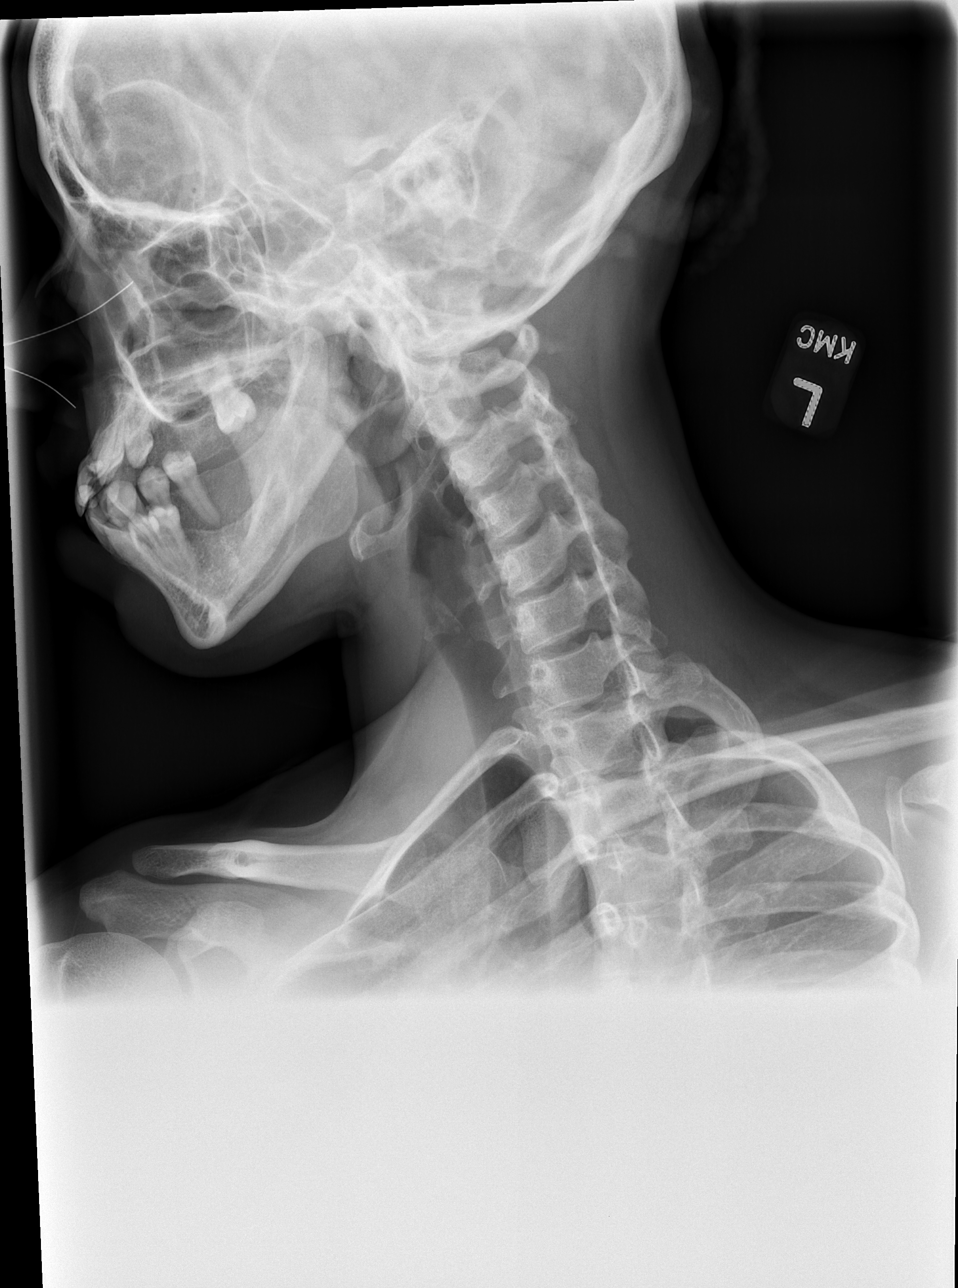

[w c-spine a.p. *]
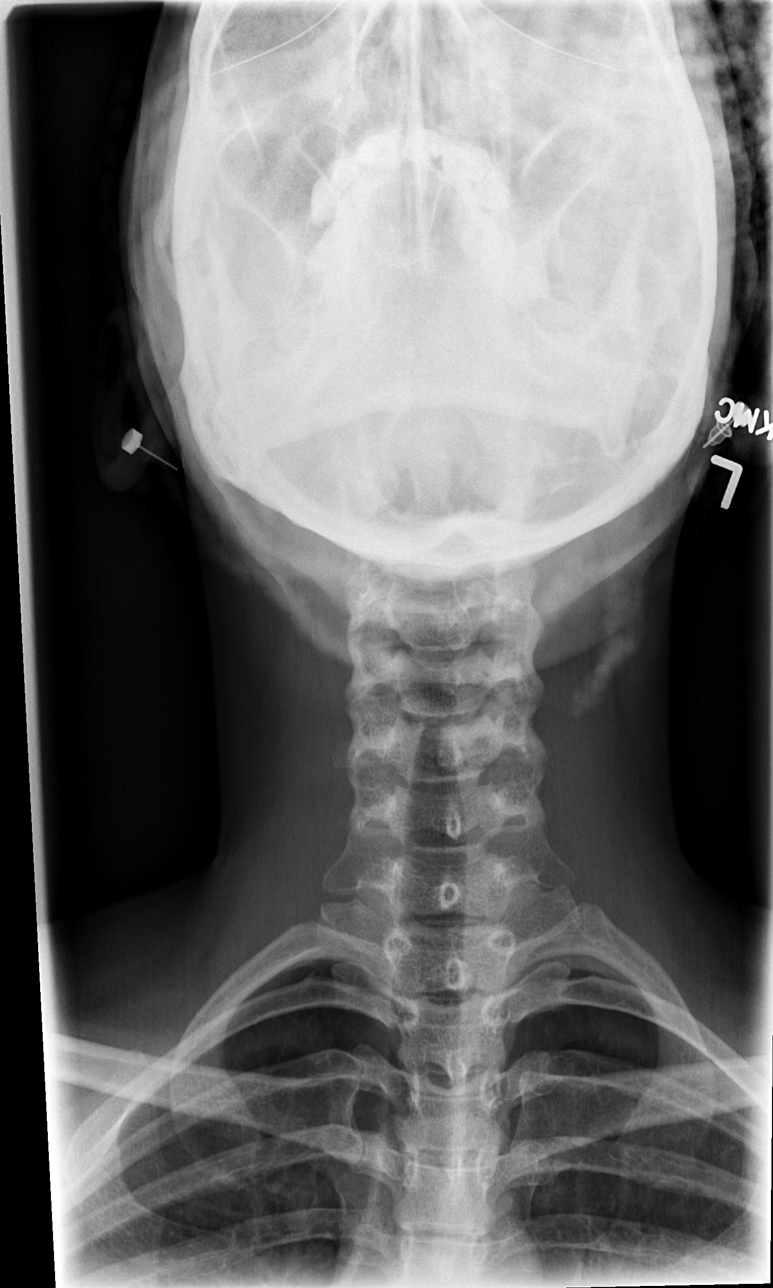

[4 of 4 positions shown; findings below may reference images not displayed]

FINDINGS: There is no evidence of cervical spine fracture or prevertebral soft
tissue swelling. Alignment is normal. No other significant bone
abnormalities are identified.
IMPRESSION: Negative cervical spine radiographs.

## 2023-05-24 DIAGNOSIS — H5213 Myopia, bilateral: Secondary | ICD-10-CM | POA: Diagnosis not present

## 2023-10-04 ENCOUNTER — Ambulatory Visit: Payer: Medicaid Other | Admitting: Student

## 2023-10-04 NOTE — Patient Instructions (Incomplete)
It was great to see you! Thank you for allowing me to participate in your care!  I recommend that you always bring your medications to each appointment as this makes it easy to ensure we are on the correct medications and helps [place] not miss when refills are needed.  Our plans for today:  - Vaginal Discharge -   We are checking some labs today, I will call you if they are abnormal will send you a MyChart message or a letter if they are normal.  If you do not hear about your labs in the next 2 weeks please let us know.***  Take care and seek immediate care sooner if you develop any concerns.   Dr. Bess Kinds, MD [facility]

## 2023-10-04 NOTE — Progress Notes (Deleted)
  SUBJECTIVE:   CHIEF COMPLAINT / HPI:   Vaginal Discharge  PERTINENT  PMH / PSH: ***  Past Medical History:  Diagnosis Date   Anemia    Anxiety    Depression    Scoliosis    Thoracic back pain    OBJECTIVE:  There were no vitals taken for this visit. Physical Exam   ASSESSMENT/PLAN:   Assessment & Plan  No follow-ups on file. Bess Kinds, MD 10/04/2023, 7:23 AM PGY-***, [facility] {    This will disappear when note is signed, click to select method of visit    :1}

## 2023-11-19 ENCOUNTER — Ambulatory Visit

## 2023-11-20 ENCOUNTER — Ambulatory Visit

## 2023-11-20 VITALS — BP 105/75 | HR 99 | Ht 62.0 in | Wt 157.6 lb

## 2023-11-20 DIAGNOSIS — Z3201 Encounter for pregnancy test, result positive: Secondary | ICD-10-CM | POA: Diagnosis present

## 2023-11-20 LAB — POCT URINE PREGNANCY: Preg Test, Ur: POSITIVE — AB

## 2023-11-20 MED ORDER — PRENATAL VITAMINS 28-0.8 MG PO TABS: 1.0000 | ORAL_TABLET | Freq: Every day | ORAL | 10 refills | Status: AC

## 2023-11-20 NOTE — Progress Notes (Signed)
    SUBJECTIVE:   CHIEF COMPLAINT / HPI:   Positive home UPT 2 Sundays ago 3/23 Pregnancy is unplanned, pt unsure whether she wants to proceed  [id number] Hx 2 emergency C-sections due to nuchal cord and ?heart defect   LMP 10/14/2023 - 10/20/2023, exact  Current medications include:  none  Smoking: none Alcohol: none  Not taking PNV  Prenatal care: desires OBGYN Desired pediatrician: unsure  Last pap 2022, NILM   PERTINENT  PMH / PSH: as above  OBJECTIVE:   BP 105/75   Pulse 99   Ht 5\' 2"  (1.575 m)   Wt 157 lb 9.6 oz (71.5 kg)   LMP 10/14/2023 (Exact Date)   SpO2 99%   BMI 28.83 kg/m   General: NAD, pleasant, able to participate in exam Cardiac: RRR, no murmurs auscultated Respiratory: CTAB, normal WOB Abdomen: soft, non-tender, non-distended, normoactive bowel sounds Extremities: warm and well perfused, no edema or cyanosis Skin: warm and dry, no rashes noted Neuro: alert, no obvious focal deficits, speech normal Psych: Normal affect and mood  ASSESSMENT/PLAN:   Assessment & Plan Positive pregnancy test Confirmed in clinic today [id number] Est GA [redacted]w[redacted]d per LMP Dating [place] scheduled 4/4 Initial OB labs  - scheduled for lab appt 4/3 per pt preference Referral OBGYN per pt preference, also hx c-section Provided resources regarding safe pregnancy termination if pt desires   Vonna Drafts, MD [facility]

## 2023-11-20 NOTE — Patient Instructions (Addendum)
 Your pregnancy test is positive. I have ordered a dating ultrasound and placed a referral to OBGYN. Your ultrasound is scheduled for 4/4, please arrive at 1:30PM with a full bladder  Your lab appointment is scheduled for TOMORROW 4/3 at [facility].  Please call the Center for [facility] at [phone]   Please let us know if you do not hear from them in the next 2 weeks   I am considering pregnancy termination---how do I find more information?    For many patients, finding out about pregnancy is emotional and brings up many questions.   Termination is safe and legal---the earlier you seek care, the more options you have.    As of 2023, elective terminations must be completed before 12 weeks and 6 days gestation in [place].  There is also a 72-hour wait period after your initial visit.     It can be hard to find good resources with reliable information. Please check out these links below:   National Abortion Federation    DiscoReview.be   azureicus.com    Planned Parenthood   https://www.plannedparenthood.org/planned-parenthood[place]    If you want someone to talk with, you can call the NAF Hotline. This is free, confidential, and they provide accurate information. They can also help with financial assistance   [phone]   Or you can connect online at PragueLog.hu   What about emotional support before and after?   Just Choice (justchoice.org) offers all-options counseling support to all people we serve. They can help create plans that center you when adoption or parenting is chosen.   Imagine Counseling with [facility] Pam (beforeandafterabortion.com) via phone is available for your comfort and ease. Claris Gower has produced helpful questions and information and an audio workbook is available.   Exhale (exhaleprovoice.org) post-abortion counseling. Need to talk with someone about your abortion experience?  Looking to support your partner, child, or friend after their abortion? Text Exhale Pro-Voice, 714-758-5145, for nonjudgmental after-abortion support. Available in the [place] and [place]:    Pregnancy Options (pregnancyoptions.info) has important information on abortion and helpful decision-making tools, including a Workbook.   How can I afford a termination?  Below are some resources    [place] [facility] ([place]abortionfund.org) CAF operates a confidential, toll-free helpline that provides financial, practical, and emotional support to callers in [place] and [place] trying to access abortion care. If you are a minor and need an abortion, they know that parental consent isn't always an option, and they can help. Text ABBY at 7173722145.   National Abortion Federation: (647) 226-7544          [facility] [place]   Address:  [address]  [place], [place] [address]   Website:   https://www.howe-bennett.[email or web address]/   Contact Information:  [phone]   Services:  Medication (up to 11 weeks) and Surgical Abortions  (up to 12 and 6 days weeks)       [facility]   Address:  [address], [place], [place] [address]   Website:  https://www.plannedparenthood.org/health-[email or web address]/[email or web address]-[address]    Contact Information:  [phone]   Services:  Medication (<11 weeks) and surgical terminations  (<12 weeks and 6 days)          [facility]:  Address    [address] [place]   [place], [place] [address]     Website:  StatOfficial.co.za    Contact Information:  [phone]   Services:  Medical abortions only (<10 weeks)

## 2023-11-21 ENCOUNTER — Other Ambulatory Visit: Payer: Self-pay

## 2023-11-22 ENCOUNTER — Ambulatory Visit (HOSPITAL_COMMUNITY): Admission: RE | Admit: 2023-11-22 | Source: Ambulatory Visit

## 2024-01-08 ENCOUNTER — Inpatient Hospital Stay (HOSPITAL_COMMUNITY)
Admission: AD | Admit: 2024-01-08 | Discharge: 2024-01-08 | Disposition: A | Discharge status: Home / Self Care | Attending: Obstetrics and Gynecology | Admitting: Obstetrics and Gynecology

## 2024-01-08 ENCOUNTER — Inpatient Hospital Stay (HOSPITAL_COMMUNITY)

## 2024-01-08 ENCOUNTER — Other Ambulatory Visit: Payer: Self-pay | Admitting: Advanced Practice Midwife

## 2024-01-08 ENCOUNTER — Ambulatory Visit (INDEPENDENT_AMBULATORY_CARE_PROVIDER_SITE_OTHER): Admitting: Family Medicine

## 2024-01-08 ENCOUNTER — Telehealth: Payer: Self-pay | Admitting: Advanced Practice Midwife

## 2024-01-08 VITALS — BP 113/86 | HR 82 | Ht 62.0 in | Wt 155.0 lb

## 2024-01-08 DIAGNOSIS — R9389 Abnormal findings on diagnostic imaging of other specified body structures: Secondary | ICD-10-CM | POA: Diagnosis not present

## 2024-01-08 DIAGNOSIS — O3680X0 Pregnancy with inconclusive fetal viability, not applicable or unspecified: Secondary | ICD-10-CM | POA: Diagnosis not present

## 2024-01-08 DIAGNOSIS — Z3A00 Weeks of gestation of pregnancy not specified: Secondary | ICD-10-CM | POA: Diagnosis not present

## 2024-01-08 DIAGNOSIS — O036 Delayed or excessive hemorrhage following complete or unspecified spontaneous abortion: Secondary | ICD-10-CM | POA: Diagnosis present

## 2024-01-08 DIAGNOSIS — Z6790 Unspecified blood type, Rh positive: Secondary | ICD-10-CM | POA: Insufficient documentation

## 2024-01-08 DIAGNOSIS — O209 Hemorrhage in early pregnancy, unspecified: Secondary | ICD-10-CM | POA: Diagnosis not present

## 2024-01-08 DIAGNOSIS — N939 Abnormal uterine and vaginal bleeding, unspecified: Secondary | ICD-10-CM

## 2024-01-08 LAB — HCG, QUANTITATIVE, PREGNANCY: hCG, Beta Chain, Quant, S: 287 m[IU]/mL — ABNORMAL HIGH (ref <5)

## 2024-01-08 NOTE — MAU Provider Note (Signed)
 Chief Complaint: Vaginal Bleeding   None     SUBJECTIVE HPI: Ruth Howard is a 33 y.o. G1P0 at [redacted]w[redacted]d by LMP who is status post termination on 5/6 with bleeding becoming lighter until 5/18 when the bleeding became heavy again.  Heavy bleeding with large clots continued and has worsened today. There is associated abdominal cramping.   She does report unprotected intercourse since the termination.  HPI  Past Medical History:  Diagnosis Date   Anemia    Anxiety    Depression    Scoliosis    Thoracic back pain    Past Surgical History:  Procedure Laterality Date   CESAREAN SECTION     2016, 2019   Social History   Socioeconomic History   Marital status: Single    Spouse name: Not on file   Number of children: 4   Years of education: currently in college   Highest education level: Not on file  Occupational History   Occupation: home health aid    Employer: [employer]  Tobacco Use   Smoking status: Former    Current packs/day: 0.50    Average packs/day: 0.5 packs/day for 3.0 years (1.5 ttl pk-yrs)    Types: Cigarettes   Smokeless tobacco: Never   Tobacco comments:    01/2019  Substance and Sexual Activity   Alcohol use: Yes    Comment: social - rare   Drug use: Not Currently    Types: Oxycodone   Sexual activity: Yes    Birth control/protection: None  Other Topics Concern   Not on file  Social History Narrative   Lives at home with her children.   Right-handed.   No daily use of caffeine.   Social Drivers of Corporate investment banker Strain: Not on file  Food Insecurity: Not on file  Transportation Needs: Not on file  Physical Activity: Not on file  Stress: Not on file  Social Connections: Not on file  Intimate Partner Violence: Unknown (05/26/2019)   Humiliation, Afraid, Rape, and Kick questionnaire    Fear of Current or Ex-Partner: Not asked    Emotionally Abused: Not on file    Physically Abused: Not on file    Sexually Abused: Not on  file   No current facility-administered medications on file prior to encounter.   Current Outpatient Medications on File Prior to Encounter  Medication Sig Dispense Refill   baclofen  (LIORESAL ) 10 MG tablet Take 1 tablet (10 mg total) by mouth 3 (three) times daily. 30 each 0   Doxylamine-Pyridoxine (DICLEGIS ) 10-10 MG TBEC Take 1 tablet by mouth at bedtime. (Patient not taking: Reported on 02/20/2023) 60 tablet 0   erythromycin  ophthalmic ointment Place a 1/2 inch ribbon of ointment into the lower eyelid four times dayily for 7 days 3.5 g 0   gabapentin  (NEURONTIN ) 100 MG capsule Take 1 capsule (100 mg total) by mouth 3 (three) times daily. (Patient not taking: Reported on 02/20/2023) 90 capsule 3   Prenatal Vit-Fe Fumarate-FA (PRENATAL VITAMINS) 28-0.8 MG TABS Take 1 tablet by mouth daily. 30 tablet 10   Allergies  Allergen Reactions   Morphine Itching   Peanut Oil Other (See Comments)    Throat swelling   Peanut-Containing Drug Products     ROS:  Review of Systems  Constitutional:  Negative for chills, fatigue and fever.  Eyes:  Negative for visual disturbance.  Respiratory:  Negative for shortness of breath.   Cardiovascular:  Negative for chest pain.  Gastrointestinal:  Positive  for abdominal pain. Negative for nausea and vomiting.  Genitourinary:  Positive for vaginal bleeding. Negative for difficulty urinating, dysuria, flank pain, pelvic pain, vaginal discharge and vaginal pain.  Neurological:  Negative for dizziness and headaches.  Psychiatric/Behavioral: Negative.       I have reviewed patient's Past Medical Hx, Surgical Hx, Family Hx, Social Hx, medications and allergies.   Physical Exam  Patient Vitals for the past 24 hrs:  BP Temp Pulse Resp Height Weight  01/08/24 1229 125/87 98.2 F (36.8 C) 84 18 5\' 2"  (1.575 m) 70.3 kg   Constitutional: Well-developed, well-nourished female in no acute distress.  Cardiovascular: normal rate Respiratory: normal effort GI: Abd  soft, non-tender. Pos BS x 4 MS: Extremities nontender, no edema, normal ROM Neurologic: Alert and oriented x 4.  GU: Neg CVAT.  PELVIC EXAM: deferred    LAB RESULTS Results for orders placed or performed during the hospital encounter of 01/08/24 (from the past 24 hours)  hCG, quantitative, pregnancy     Status: Abnormal   Collection Time: 01/08/24  1:03 PM  Result Value Ref Range   hCG, Beta Chain, Quant, S 287 (H) <5 mIU/mL       IMAGING US  OB LESS THAN 14 WEEKS WITH OB TRANSVAGINAL Result Date: 01/08/2024 CLINICAL DATA:  Vaginal bleeding in first trimester of pregnancy. EXAM: OBSTETRIC <14 WK US  AND TRANSVAGINAL OB US  TECHNIQUE: Both transabdominal and transvaginal ultrasound examinations were performed for complete evaluation of the gestation as well as the maternal uterus, adnexal regions, and pelvic cul-de-sac. Transvaginal technique was performed to assess early pregnancy. COMPARISON:  None Available. FINDINGS: Intrauterine gestational sac: None Yolk sac:  Not Visualized. Embryo:  Not Visualized. Cardiac Activity: Not Visualized. Maternal uterus/adnexae: Ovaries are unremarkable. Trace free fluid is noted which most likely is physiologic. Thickened endometrium is noted. IMPRESSION: No intrauterine gestational sac, yolk sac, fetal pole, or cardiac activity visualized. Differential considerations include intrauterine gestation too early to be sonographically visualized, spontaneous abortion, or ectopic pregnancy. Consider follow-up ultrasound in 14 days and serial quantitative beta HCG follow-up. Electronically Signed   By: Rosalene Colon M.D.   On: 01/08/2024 16:02    MAU Management/MDM: Orders Placed This Encounter  Procedures   US  OB LESS THAN 14 WEEKS WITH OB TRANSVAGINAL   hCG, quantitative, pregnancy    No orders of the defined types were placed in this encounter.   Findings today could represent a normal early pregnancy, spontaneous abortion or ectopic pregnancy which can  be life-threatening.  Ectopic precautions were given to the patient with plan to return in 48 hours for repeat quant hcg to evaluate pregnancy development. Pt unable to come to [facility] on Friday before office closes so plan to return to MAU Friday afternoon, when she is out of work.  Return sooner as needed for emergencies.     ASSESSMENT 1. Pregnancy of unknown anatomic location   2. Blood type, Rh positive   3. Vaginal bleeding in pregnancy, first trimester   4. Abortion with bleeding     PLAN Discharge home    Arlester Bence Certified Nurse-Midwife 01/08/2024  7:17 PM

## 2024-01-08 NOTE — Progress Notes (Signed)
    SUBJECTIVE:   CHIEF COMPLAINT / HPI: Abortion follow-up  Had positive pregnancy test on April 2. Had an abortion on May 5th in [place]. Had ultrasound in [place]. Took pills thinks both start with an M. First started have bleeding on the 6th and passed products of conception. Bleeding decreased after that over several days. First had new bleeding on Monday 18th. That has been getting heavier with more clotting.  Had chills over the weekend, but no fevers. No lightheaded or dizziness. Has new crampy pain as well.   PERTINENT  PMH / PSH: Anemia, C-section x2  OBJECTIVE:   BP 113/86   Pulse 82   Ht 5\' 2"  (1.575 m)   Wt 155 lb (70.3 kg)   LMP 10/14/2023 (Exact Date)   SpO2 100%   BMI 28.35 kg/m   General: NAD, well appearing Neuro: A&O Cardiovascular: RRR, no murmurs, no peripheral edema Respiratory: normal WOB on RA, CTAB, no wheezes, ronchi or rales Abdomen: soft, NTTP, no rebound or guarding Extremities: Moving all 4 extremities equally GU: Normal external genitalia, no obvious erythema, swelling, or lesions. No obvious vaginal discharge, vaginal walls without erythema or lesions.  Small clots present in the vaginal vault, no active bleeding, cervix round, red, no lesions.   ASSESSMENT/PLAN:   Assessment & Plan Vaginal bleeding Patient's history is concern for retained products of conception after medical termination, as she has had an increase in vaginal bleeding 2 weeks after medical termination.  She is reassuringly hemodynamically stable without an active bleed today on exam.  She reassuringly also does not demonstrate signs of active infection.  Unfortunately, she requires urgent pelvic ultrasound to assess for retained products of conception for potential dilation and curettaged.  This will best be performed at the MAU.  I have counseled the patient on this plan who is agreeable to going from clinic to the MAU.  She is stable to transport herself.  Patient  verbalized understanding.  I provided map with MAU address.  Return in about 1 month (around 02/08/2024).  Ivin Marrow, MD [facility]

## 2024-01-08 NOTE — Progress Notes (Signed)
 duplicate

## 2024-01-08 NOTE — MAU Note (Signed)
 Ruth Howard is a 33 y.o. at [redacted]w[redacted]d here in MAU reporting: Took Abortion medication on MAY 5-6 Had bleeding afterwards and felt like she had passed everything. Bleeding started to get lighter then on May 18 she started bleeding again moderate and passing clots with cramping.  Has been taking tylenol  and ibuprofen  and it has helped with cramping. Has had intercourse since taking the meds.   LMP:  Onset of complaint: 01/05/24 Pain score: 4 Vitals:   01/08/24 1229  BP: 125/87  Pulse: 84  Resp: 18  Temp: 98.2 F (36.8 C)     FHT:   Lab orders placed from triage:

## 2024-01-08 NOTE — Patient Instructions (Addendum)
 It was great to see you! Thank you for allowing me to participate in your care!  Our plans for today:  - Please go to the MAU, this is the emergency room for women with pregnancy related health issues. - I am worried that some of the fetus remained after the abortion.   Please arrive 15 minutes PRIOR to your next scheduled appointment time! If you do not, this affects OTHER patients' care.  Take care and seek immediate care sooner if you develop any concerns.   Ivin Marrow, MD, PGY-2 [facility] 11:03 AM 01/08/2024  [facility]   Please make a follow up appointment in 2 weeks.  These are all concerning in pregnancy and if you have any of these I recommend you call your PCP and present to the Maternity Admissions Unit (map below) for further evaluation.  For any pregnancy-related emergencies, please go to the Maternity Admissions Unit in the [facility] at [facility]. You will use hospital Entrance C.    Our clinic number is [phone].   Dr Drue Gerald

## 2024-01-08 NOTE — Telephone Encounter (Signed)
 Patient had to leave MAU earlier without US  results. I called patient and verified identity using 2 markers.  I discussed US  findings with patient.  No IUP or ectopic were seen.  Thickened endometrium was noted.  Pt to f/u with repeat quant hcg in 48 hours.  Pt unable to come to the office on Friday morning, so will return to MAU on Friday afternoon for repeat hcg.  Return sooner as needed for emergencies.

## 2024-01-10 ENCOUNTER — Inpatient Hospital Stay (HOSPITAL_COMMUNITY)
Admission: AD | Admit: 2024-01-10 | Discharge: 2024-01-10 | Disposition: A | Discharge status: Home / Self Care | Attending: Obstetrics & Gynecology | Admitting: Obstetrics & Gynecology

## 2024-01-10 ENCOUNTER — Telehealth: Payer: Self-pay | Admitting: Family Medicine

## 2024-01-10 ENCOUNTER — Ambulatory Visit

## 2024-01-10 DIAGNOSIS — O039 Complete or unspecified spontaneous abortion without complication: Secondary | ICD-10-CM | POA: Diagnosis present

## 2024-01-10 DIAGNOSIS — Z3201 Encounter for pregnancy test, result positive: Secondary | ICD-10-CM | POA: Diagnosis not present

## 2024-01-10 DIAGNOSIS — Z3490 Encounter for supervision of normal pregnancy, unspecified, unspecified trimester: Secondary | ICD-10-CM

## 2024-01-10 LAB — HCG, QUANTITATIVE, PREGNANCY: hCG, Beta Chain, Quant, S: 150 m[IU]/mL — ABNORMAL HIGH (ref <5)

## 2024-01-10 NOTE — Discharge Instructions (Signed)
 Thank you for allowing us  to care for you in the MAU today.  Please come back to the MAU if you develop severe abdominal pain, heavy vaginal bleeding, or fever.

## 2024-01-10 NOTE — MAU Note (Signed)
 Ruth Howard is a 33 y.o. at [redacted]w[redacted]d here in MAU reporting: here for repeat blood HCG levels. Denies any pain today.  Bleeding has gotten much lighter, now less than "light" amt on pictures.  Onset of complaint: ongoing Pain score: none Vitals:   01/10/24 1248  BP: 126/85  Pulse: (!) 105  Resp: 16  Temp: 97.9 F (36.6 C)  SpO2: 100%      Lab orders placed from triage:  HCG has been  Drawn    HX, termination 5/6, irregular bleeding following. Here 5/21, no preg seen on US . HCG was 287.

## 2024-01-10 NOTE — MAU Provider Note (Signed)
 None     S Ms. Makhayla Mcmurry is a 33 y.o. G1P0 pregnant/non-pregnant female at [redacted]w[redacted]d who presents to MAU today for repeat hCG. Denies pain today, endorses bleeding has become much lighter. History of termination on 12/24/2023, seen at MAU 01/08/2024 for abdominal cramping and heavy vaginal bleeding. Reported unprotected intercourse during the time between termination and MAU evaluation. hCG 287 and US  without  intrauterine gestational sac, yolk sac, fetal pole, or cardiac activity visualized, differential included normal early pregnancy, spontaneous abortion, or ectopic pregnancy. Recommendation to repeat hCG in 48 hours.  Receives care at [facility]. Prenatal records reviewed.  Pertinent items noted in HPI and remainder of comprehensive ROS otherwise negative.   O BP 126/85 (BP Location: Right Arm)   Pulse (!) 105   Temp 97.9 F (36.6 C) (Oral)   Resp 16   Ht 5\' 2"  (1.575 m)   Wt 68.1 kg   LMP 10/14/2023 (Exact Date)   SpO2 100%   BMI 27.45 kg/m  Physical Exam Vitals reviewed.  Constitutional:      General: She is not in acute distress.    Appearance: She is well-developed. She is not diaphoretic.  Eyes:     General: No scleral icterus. Pulmonary:     Effort: Pulmonary effort is normal. No respiratory distress.  Skin:    General: Skin is warm and dry.  Neurological:     Mental Status: She is alert.     Coordination: Coordination normal.    Results for orders placed or performed during the hospital encounter of 01/10/24 (from the past 24 hours)  hCG, quantitative, pregnancy     Status: Abnormal   Collection Time: 01/10/24 12:04 PM  Result Value Ref Range   hCG, Beta Chain, Quant, S 150 (H) <5 mIU/mL    MDM: Low MAU Course: -hCG has declined from 287 to 150 over 48 hours.  A 1. Spontaneous abortion in first trimester (Primary) - Discharge patient  2. Pregnancy with gestation of unknown duration - Discharge patient   Medical screening exam complete  P Discharge from  MAU in stable condition with infection precautions. Agreeable to expectant management with serial hCG weekly until nonpregnant levels. Follow up with PCP, discuss contraception options if pregnancy is not desired.  No future appointments. Allergies as of 01/10/2024       Reactions   Morphine Itching   Peanut Oil Other (See Comments)   Throat swelling   Peanut-containing Drug Products         Medication List     TAKE these medications    baclofen  10 MG tablet Commonly known as: LIORESAL  Take 1 tablet (10 mg total) by mouth 3 (three) times daily.   erythromycin  ophthalmic ointment Place a 1/2 inch ribbon of ointment into the lower eyelid four times dayily for 7 days   Prenatal Vitamins 28-0.8 MG Tabs Take 1 tablet by mouth daily.      Noreene Bearded, PA

## 2024-01-10 NOTE — Telephone Encounter (Signed)
 Patient had STAT appt with us  today but missed it and I called to offer her to come in by 11am but she declined and said she would be going to MAU after work.

## 2024-01-15 ENCOUNTER — Other Ambulatory Visit: Payer: Self-pay

## 2024-01-15 ENCOUNTER — Telehealth: Payer: Self-pay

## 2024-01-15 DIAGNOSIS — O039 Complete or unspecified spontaneous abortion without complication: Secondary | ICD-10-CM

## 2024-01-15 NOTE — Telephone Encounter (Addendum)
 Called patient regarding physician's advise for a repeat lab draw. Patient scheduled for hcg lab appointment on 01/17/24 at 1030. Patient confirmed scheduled appointment and address to clinic provided. Reviewed MAU precautions with patient and all questions addressed.   Moira Andrews, RN   ----- Message from Noreene Bearded sent at 01/10/2024  1:56 PM EDT ----- Please contact patient to schedule lab appointment for repeat hCG in about 1 week. Plan is for serial weekly hCG until at nonpregnant levels.  Thank you!

## 2024-01-17 ENCOUNTER — Other Ambulatory Visit

## 2024-01-17 ENCOUNTER — Other Ambulatory Visit: Payer: Self-pay

## 2024-01-17 DIAGNOSIS — O039 Complete or unspecified spontaneous abortion without complication: Secondary | ICD-10-CM

## 2024-01-17 DIAGNOSIS — Z3201 Encounter for pregnancy test, result positive: Secondary | ICD-10-CM

## 2024-01-22 ENCOUNTER — Telehealth: Payer: Self-pay | Admitting: Family Medicine

## 2024-01-22 ENCOUNTER — Ambulatory Visit: Payer: Self-pay | Admitting: Family Medicine

## 2024-01-22 LAB — URINE CULTURE, OB REFLEX

## 2024-01-22 LAB — CBC/D/PLT+RPR+RH+ABO+RUBIGG...
Basophils Absolute: 0 10*3/uL (ref 0.0–0.2)
Basos: 1 %
Bilirubin, UA: NEGATIVE
EOS (ABSOLUTE): 0.1 10*3/uL (ref 0.0–0.4)
Eos: 2 %
Glucose, UA: NEGATIVE
HCV Ab: NONREACTIVE
HIV Screen 4th Generation wRfx: NONREACTIVE
Hematocrit: 33.3 % — ABNORMAL LOW (ref 34.0–46.6)
Hemoglobin: 10.1 g/dL — ABNORMAL LOW (ref 11.1–15.9)
Hepatitis B Surface Ag: NEGATIVE
Immature Grans (Abs): 0 10*3/uL (ref 0.0–0.1)
Immature Granulocytes: 0 %
Ketones, UA: NEGATIVE
Lymphocytes Absolute: 2 10*3/uL (ref 0.7–3.1)
Lymphs: 47 %
MCH: 23.9 pg — ABNORMAL LOW (ref 26.6–33.0)
MCHC: 30.3 g/dL — ABNORMAL LOW (ref 31.5–35.7)
MCV: 79 fL (ref 79–97)
Monocytes Absolute: 0.4 10*3/uL (ref 0.1–0.9)
Monocytes: 9 %
Neutrophils Absolute: 1.8 10*3/uL (ref 1.4–7.0)
Neutrophils: 41 %
Nitrite, UA: NEGATIVE
Platelets: 271 10*3/uL (ref 150–450)
RBC: 4.23 x10E6/uL (ref 3.77–5.28)
RDW: 14.7 % (ref 11.7–15.4)
RPR Ser Ql: NONREACTIVE
Rh Factor: POSITIVE
Rubella Antibodies, IGG: 14.9 {index} (ref >0.99)
Specific Gravity, UA: 1.014 (ref 1.005–1.030)
Urobilinogen, Ur: 0.2 mg/dL (ref 0.2–1.0)
WBC: 4.2 10*3/uL (ref 3.4–10.8)
pH, UA: 8 — ABNORMAL HIGH (ref 5.0–7.5)

## 2024-01-22 LAB — MICROSCOPIC EXAMINATION
Casts: NONE SEEN /LPF
Epithelial Cells (non renal): >10 /HPF — AB (ref 0–10)

## 2024-01-22 LAB — HCV INTERPRETATION

## 2024-01-22 LAB — BETA HCG QUANT (REF LAB): hCG Quant: 32 m[IU]/mL

## 2024-01-22 LAB — AB SCR+ANTIBODY ID: Antibody Screen: POSITIVE — AB

## 2024-01-22 NOTE — Telephone Encounter (Signed)
 Spoke to patient via telephone about her appointment.

## 2024-01-26 ENCOUNTER — Ambulatory Visit: Payer: Self-pay | Admitting: Family Medicine

## 2024-01-28 ENCOUNTER — Encounter: Payer: Self-pay | Admitting: *Deleted

## 2024-01-29 ENCOUNTER — Ambulatory Visit: Admitting: Obstetrics and Gynecology

## 2024-01-30 ENCOUNTER — Ambulatory Visit: Admitting: Obstetrics and Gynecology

## 2024-03-31 ENCOUNTER — Ambulatory Visit

## 2024-03-31 NOTE — Progress Notes (Deleted)
    SUBJECTIVE:   CHIEF COMPLAINT / HPI: pregnancy test  ***  PERTINENT  PMH / PSH: ***  OBJECTIVE:   LMP 10/14/2023 (Exact Date)   ***  ASSESSMENT/PLAN:   Assessment & Plan  No follow-ups on file.  Ozell Provencal, MD [facility]

## 2024-04-07 ENCOUNTER — Emergency Department (HOSPITAL_BASED_OUTPATIENT_CLINIC_OR_DEPARTMENT_OTHER)
Admission: EM | Admit: 2024-04-07 | Discharge: 2024-04-08 | Disposition: A | Discharge status: Home / Self Care | Attending: Emergency Medicine | Admitting: Emergency Medicine

## 2024-04-07 ENCOUNTER — Other Ambulatory Visit: Payer: Self-pay

## 2024-04-07 ENCOUNTER — Encounter (HOSPITAL_BASED_OUTPATIENT_CLINIC_OR_DEPARTMENT_OTHER): Payer: Self-pay | Admitting: Emergency Medicine

## 2024-04-07 DIAGNOSIS — Z91040 Latex allergy status: Secondary | ICD-10-CM | POA: Insufficient documentation

## 2024-04-07 DIAGNOSIS — K0889 Other specified disorders of teeth and supporting structures: Secondary | ICD-10-CM | POA: Diagnosis not present

## 2024-04-07 LAB — COMPREHENSIVE METABOLIC PANEL WITH GFR
ALT: 51 U/L — ABNORMAL HIGH (ref 0–44)
AST: 39 U/L (ref 15–41)
Albumin: 4.7 g/dL (ref 3.5–5.0)
Alkaline Phosphatase: 107 U/L (ref 38–126)
Anion gap: 14 (ref 5–15)
BUN: 12 mg/dL (ref 6–20)
CO2: 22 mmol/L (ref 22–32)
Calcium: 9.8 mg/dL (ref 8.9–10.3)
Chloride: 101 mmol/L (ref 98–111)
Creatinine, Ser: 0.74 mg/dL (ref 0.44–1.00)
GFR, Estimated: >60 mL/min (ref >60)
Glucose, Bld: 103 mg/dL — ABNORMAL HIGH (ref 70–99)
Potassium: 4 mmol/L (ref 3.5–5.1)
Sodium: 137 mmol/L (ref 135–145)
Total Bilirubin: 0.3 mg/dL (ref 0.0–1.2)
Total Protein: 8.6 g/dL — ABNORMAL HIGH (ref 6.5–8.1)

## 2024-04-07 LAB — CBC WITH DIFFERENTIAL/PLATELET
Abs Immature Granulocytes: 0.02 K/uL (ref 0.00–0.07)
Basophils Absolute: 0.1 K/uL (ref 0.0–0.1)
Basophils Relative: 1 %
Eosinophils Absolute: 0.1 K/uL (ref 0.0–0.5)
Eosinophils Relative: 1 %
HCT: 36 % (ref 36.0–46.0)
Hemoglobin: 10.9 g/dL — ABNORMAL LOW (ref 12.0–15.0)
Immature Granulocytes: 0 %
Lymphocytes Relative: 35 %
Lymphs Abs: 2.4 K/uL (ref 0.7–4.0)
MCH: 22.9 pg — ABNORMAL LOW (ref 26.0–34.0)
MCHC: 30.3 g/dL (ref 30.0–36.0)
MCV: 75.5 fL — ABNORMAL LOW (ref 80.0–100.0)
Monocytes Absolute: 0.5 K/uL (ref 0.1–1.0)
Monocytes Relative: 8 %
Neutro Abs: 3.7 K/uL (ref 1.7–7.7)
Neutrophils Relative %: 55 %
Platelets: 333 K/uL (ref 150–400)
RBC: 4.77 MIL/uL (ref 3.87–5.11)
RDW: 15.9 % — ABNORMAL HIGH (ref 11.5–15.5)
WBC: 6.4 K/uL (ref 4.0–10.5)
nRBC: 0 % (ref 0.0–0.2)

## 2024-04-07 NOTE — ED Triage Notes (Signed)
 Pt reports she has a root canal on front RT incisor that was done 2 years ago that was not capped, has been battling infection ever since, started ABT 2 weeks ago, stopped briefly and then restarted, finished last dose yesterday  Reports pain and infection still an issue

## 2024-04-08 MED ORDER — IBUPROFEN 800 MG PO TABS
800.0000 mg | ORAL_TABLET | Freq: Once | ORAL | Status: AC
  Administered 2024-04-08: 800 mg via ORAL
  Filled 2024-04-08: qty 1

## 2024-04-08 MED ORDER — ACETAMINOPHEN 500 MG PO TABS
1000.0000 mg | ORAL_TABLET | Freq: Once | ORAL | Status: AC
  Administered 2024-04-08: 1000 mg via ORAL
  Filled 2024-04-08: qty 2

## 2024-04-08 MED ORDER — IBUPROFEN 800 MG PO TABS: 800.0000 mg | ORAL_TABLET | Freq: Four times a day (QID) | ORAL | 0 refills | Status: AC | PRN

## 2024-04-08 MED ORDER — AMOXICILLIN-POT CLAVULANATE 875-125 MG PO TABS: 1.0000 | ORAL_TABLET | Freq: Two times a day (BID) | ORAL | 0 refills | Status: AC

## 2024-04-08 MED ORDER — AMOXICILLIN-POT CLAVULANATE 875-125 MG PO TABS
1.0000 | ORAL_TABLET | Freq: Once | ORAL | Status: AC
  Administered 2024-04-08: 1 via ORAL
  Filled 2024-04-08: qty 1

## 2024-04-08 NOTE — ED Provider Notes (Signed)
 [place] [facility] Provider Note   CSN: [id number] Arrival date & time: 04/07/24  2127     Patient presents with: Dental Problem   Ruth Howard is a 33 y.o. female.   Reports that she has had ongoing problems with her upper tooth after she had a root canal 2 years ago.  She reports that intermittently she needs antibiotics for infection.  She reports that she has Medicaid, has been unable to get follow-up.       Prior to Admission medications   Medication Sig Start Date End Date Taking? Authorizing Provider  amoxicillin -clavulanate (AUGMENTIN ) 875-125 MG tablet Take 1 tablet by mouth every 12 (twelve) hours. 04/08/24  Yes Sariyah Corcino, Lonni PARAS, MD  ibuprofen  (ADVIL ) 800 MG tablet Take 1 tablet (800 mg total) by mouth every 6 (six) hours as needed for moderate pain (pain score 4-6). 04/08/24  Yes Khalib Fendley, Lonni PARAS, MD  baclofen  (LIORESAL ) 10 MG tablet Take 1 tablet (10 mg total) by mouth 3 (three) times daily. 01/31/23   Rosendo Rush, MD  erythromycin  ophthalmic ointment Place a 1/2 inch ribbon of ointment into the lower eyelid four times dayily for 7 days 03/04/23   Prosperi, Christian H, PA-C  Prenatal Vit-Fe Fumarate-FA (PRENATAL VITAMINS) 28-0.8 MG TABS Take 1 tablet by mouth daily. 11/20/23   Romelle Booty, MD    Allergies: Morphine, Peanut oil, and Peanut-containing drug products    Review of Systems  Updated Vital Signs BP 110/84 (BP Location: Right Arm)   Pulse (!) 101   Temp 98.3 F (36.8 C)   Resp 18   Ht 5' 2 (1.575 m)   Wt 70.3 kg   LMP 04/02/2024 (Exact Date)   SpO2 100%   BMI 28.35 kg/m   Physical Exam Vitals and nursing note reviewed.  Constitutional:      General: She is not in acute distress.    Appearance: She is well-developed.  HENT:     Head: Normocephalic and atraumatic.     Comments: No facial swelling, erythema or rash    Mouth/Throat:     Mouth: Mucous membranes are moist.      Comments:  Tenderness right upper lateral incisor, no surrounding swelling or signs of abscess Eyes:     General: Vision grossly intact. Gaze aligned appropriately.     Extraocular Movements: Extraocular movements intact.     Conjunctiva/sclera: Conjunctivae normal.  Cardiovascular:     Rate and Rhythm: Normal rate and regular rhythm.     Pulses: Normal pulses.     Heart sounds: Normal heart sounds, S1 normal and S2 normal. No murmur heard.    No friction rub. No gallop.  Pulmonary:     Effort: Pulmonary effort is normal. No respiratory distress.     Breath sounds: Normal breath sounds.  Abdominal:     General: Bowel sounds are normal.     Palpations: Abdomen is soft.     Tenderness: There is no abdominal tenderness. There is no guarding or rebound.     Hernia: No hernia is present.  Musculoskeletal:        General: No swelling.     Cervical back: Full passive range of motion without pain, normal range of motion and neck supple. No spinous process tenderness or muscular tenderness. Normal range of motion.     Right lower leg: No edema.     Left lower leg: No edema.  Skin:    General: Skin is warm and dry.  Capillary Refill: Capillary refill takes less than 2 seconds.     Findings: No ecchymosis, erythema, rash or wound.  Neurological:     General: No focal deficit present.     Mental Status: She is alert and oriented to person, place, and time.     GCS: GCS eye subscore is 4. GCS verbal subscore is 5. GCS motor subscore is 6.     Cranial Nerves: Cranial nerves 2-12 are intact.     Sensory: Sensation is intact.     Motor: Motor function is intact.     Coordination: Coordination is intact.  Psychiatric:        Attention and Perception: Attention normal.        Mood and Affect: Mood normal.        Speech: Speech normal.        Behavior: Behavior normal.     (all labs ordered are listed, but only abnormal results are displayed) Labs Reviewed  COMPREHENSIVE METABOLIC PANEL WITH GFR -  Abnormal; Notable for the following components:      Result Value   Glucose, Bld 103 (*)    Total Protein 8.6 (*)    ALT 51 (*)    All other components within normal limits  CBC WITH DIFFERENTIAL/PLATELET - Abnormal; Notable for the following components:   Hemoglobin 10.9 (*)    MCV 75.5 (*)    MCH 22.9 (*)    RDW 15.9 (*)    All other components within normal limits    EKG: None  Radiology: No results found.   Procedures   Medications Ordered in the ED  amoxicillin -clavulanate (AUGMENTIN ) 875-125 MG per tablet 1 tablet (has no administration in time range)  ibuprofen  (ADVIL ) tablet 800 mg (has no administration in time range)  acetaminophen  (TYLENOL ) tablet 1,000 mg (has no administration in time range)                                    Medical Decision Making Amount and/or Complexity of Data Reviewed Labs: ordered.   Presents for dental pain.  She reports ongoing issues after a dental procedure couple of years ago.  She had some amoxicillin  leftover, has taken it for 2 days without improvement.  Semination does not reveal any evidence of dental abscess currently.  She appears well.  Vital signs are unremarkable.  Informed patient that my options are limited, I can put her on a course of antibiotics, give her a list of dental resources.  She needs to see a dentist for definitive treatment of this problem.     Final diagnoses:  Pain, dental    ED Discharge Orders          Ordered    amoxicillin -clavulanate (AUGMENTIN ) 875-125 MG tablet  Every 12 hours        04/08/24 0015    ibuprofen  (ADVIL ) 800 MG tablet  Every 6 hours PRN        04/08/24 0015               Burgess Sheriff J, MD 04/08/24 [phone]
